# Patient Record
Sex: Female | Born: 1940 | Race: White | Hispanic: No | Marital: Married | Smoking: Never smoker
Health system: Southern US, Community
[De-identification: ages and names within clinical notes are randomized; demographics above are authoritative.]

## PROBLEM LIST (undated history)

## (undated) DIAGNOSIS — I10 Essential (primary) hypertension: Secondary | ICD-10-CM

## (undated) DIAGNOSIS — R569 Unspecified convulsions: Secondary | ICD-10-CM

## (undated) DIAGNOSIS — F32A Depression, unspecified: Secondary | ICD-10-CM

## (undated) DIAGNOSIS — F0280 Dementia in other diseases classified elsewhere without behavioral disturbance: Secondary | ICD-10-CM

## (undated) DIAGNOSIS — E785 Hyperlipidemia, unspecified: Secondary | ICD-10-CM

## (undated) DIAGNOSIS — G309 Alzheimer's disease, unspecified: Secondary | ICD-10-CM

## (undated) DIAGNOSIS — Z780 Asymptomatic menopausal state: Secondary | ICD-10-CM

## (undated) DIAGNOSIS — K219 Gastro-esophageal reflux disease without esophagitis: Secondary | ICD-10-CM

## (undated) DIAGNOSIS — M1990 Unspecified osteoarthritis, unspecified site: Secondary | ICD-10-CM

## (undated) DIAGNOSIS — F329 Major depressive disorder, single episode, unspecified: Secondary | ICD-10-CM

## (undated) DIAGNOSIS — R079 Chest pain, unspecified: Secondary | ICD-10-CM

## (undated) DIAGNOSIS — M810 Age-related osteoporosis without current pathological fracture: Secondary | ICD-10-CM

## (undated) HISTORY — DX: Gastro-esophageal reflux disease without esophagitis: K21.9

## (undated) HISTORY — DX: Dementia in other diseases classified elsewhere, unspecified severity, without behavioral disturbance, psychotic disturbance, mood disturbance, and anxiety: F02.80

## (undated) HISTORY — DX: Chest pain, unspecified: R07.9

## (undated) HISTORY — PX: OTHER SURGICAL HISTORY: SHX169

## (undated) HISTORY — DX: Unspecified osteoarthritis, unspecified site: M19.90

## (undated) HISTORY — DX: Essential (primary) hypertension: I10

## (undated) HISTORY — DX: Asymptomatic menopausal state: Z78.0

## (undated) HISTORY — DX: Alzheimer's disease, unspecified: G30.9

## (undated) HISTORY — DX: Unspecified convulsions: R56.9

## (undated) HISTORY — PX: APPENDECTOMY: SHX54

---

## 1998-11-28 ENCOUNTER — Other Ambulatory Visit: Admission: RE | Admit: 1998-11-28 | Discharge: 1998-11-28 | Payer: Self-pay | Admitting: *Deleted

## 2000-01-02 ENCOUNTER — Other Ambulatory Visit: Admission: RE | Admit: 2000-01-02 | Discharge: 2000-01-02 | Payer: Self-pay | Admitting: *Deleted

## 2001-02-02 ENCOUNTER — Other Ambulatory Visit: Admission: RE | Admit: 2001-02-02 | Discharge: 2001-02-02 | Payer: Self-pay | Admitting: *Deleted

## 2001-06-21 LAB — HM COLONOSCOPY

## 2004-10-21 ENCOUNTER — Encounter: Payer: Self-pay | Admitting: Family Medicine

## 2004-11-14 ENCOUNTER — Ambulatory Visit: Payer: Self-pay | Admitting: Family Medicine

## 2005-04-04 ENCOUNTER — Ambulatory Visit: Payer: Self-pay | Admitting: Family Medicine

## 2005-04-24 ENCOUNTER — Ambulatory Visit: Payer: Self-pay | Admitting: Family Medicine

## 2005-04-24 ENCOUNTER — Other Ambulatory Visit: Admission: RE | Admit: 2005-04-24 | Discharge: 2005-04-24 | Payer: Self-pay | Admitting: Family Medicine

## 2006-05-22 ENCOUNTER — Ambulatory Visit: Payer: Self-pay | Admitting: Family Medicine

## 2006-07-29 ENCOUNTER — Ambulatory Visit (HOSPITAL_COMMUNITY): Admission: RE | Admit: 2006-07-29 | Discharge: 2006-07-29 | Payer: Self-pay | Admitting: Neurology

## 2007-05-04 ENCOUNTER — Encounter: Payer: Self-pay | Admitting: Family Medicine

## 2007-05-26 ENCOUNTER — Encounter: Payer: Self-pay | Admitting: Family Medicine

## 2007-05-26 DIAGNOSIS — I10 Essential (primary) hypertension: Secondary | ICD-10-CM | POA: Insufficient documentation

## 2007-05-26 DIAGNOSIS — K219 Gastro-esophageal reflux disease without esophagitis: Secondary | ICD-10-CM

## 2007-06-08 ENCOUNTER — Encounter: Payer: Self-pay | Admitting: Family Medicine

## 2007-06-09 ENCOUNTER — Other Ambulatory Visit: Admission: RE | Admit: 2007-06-09 | Discharge: 2007-06-09 | Payer: Self-pay | Admitting: Family Medicine

## 2007-06-09 ENCOUNTER — Ambulatory Visit: Payer: Self-pay | Admitting: Family Medicine

## 2007-06-09 DIAGNOSIS — N390 Urinary tract infection, site not specified: Secondary | ICD-10-CM

## 2007-06-09 DIAGNOSIS — M818 Other osteoporosis without current pathological fracture: Secondary | ICD-10-CM

## 2007-06-09 DIAGNOSIS — N951 Menopausal and female climacteric states: Secondary | ICD-10-CM

## 2007-06-09 DIAGNOSIS — T50995A Adverse effect of other drugs, medicaments and biological substances, initial encounter: Secondary | ICD-10-CM

## 2007-06-09 DIAGNOSIS — E785 Hyperlipidemia, unspecified: Secondary | ICD-10-CM | POA: Insufficient documentation

## 2007-06-09 DIAGNOSIS — E039 Hypothyroidism, unspecified: Secondary | ICD-10-CM | POA: Insufficient documentation

## 2007-06-09 DIAGNOSIS — D649 Anemia, unspecified: Secondary | ICD-10-CM

## 2007-06-09 DIAGNOSIS — M549 Dorsalgia, unspecified: Secondary | ICD-10-CM | POA: Insufficient documentation

## 2007-06-19 ENCOUNTER — Encounter: Payer: Self-pay | Admitting: Family Medicine

## 2007-09-09 ENCOUNTER — Ambulatory Visit: Payer: Self-pay | Admitting: Family Medicine

## 2007-09-30 ENCOUNTER — Encounter: Payer: Self-pay | Admitting: Family Medicine

## 2007-12-10 ENCOUNTER — Telehealth: Payer: Self-pay | Admitting: Family Medicine

## 2007-12-24 ENCOUNTER — Ambulatory Visit: Payer: Self-pay | Admitting: Family Medicine

## 2007-12-24 DIAGNOSIS — F0280 Dementia in other diseases classified elsewhere without behavioral disturbance: Secondary | ICD-10-CM

## 2007-12-24 DIAGNOSIS — G309 Alzheimer's disease, unspecified: Secondary | ICD-10-CM

## 2007-12-24 DIAGNOSIS — S93409A Sprain of unspecified ligament of unspecified ankle, initial encounter: Secondary | ICD-10-CM | POA: Insufficient documentation

## 2008-06-23 ENCOUNTER — Ambulatory Visit: Payer: Self-pay | Admitting: Family Medicine

## 2008-06-23 DIAGNOSIS — M899 Disorder of bone, unspecified: Secondary | ICD-10-CM | POA: Insufficient documentation

## 2008-06-23 DIAGNOSIS — H6120 Impacted cerumen, unspecified ear: Secondary | ICD-10-CM

## 2008-06-23 DIAGNOSIS — M7750 Other enthesopathy of unspecified foot: Secondary | ICD-10-CM | POA: Insufficient documentation

## 2008-06-23 DIAGNOSIS — M21619 Bunion of unspecified foot: Secondary | ICD-10-CM

## 2008-06-23 DIAGNOSIS — M949 Disorder of cartilage, unspecified: Secondary | ICD-10-CM

## 2008-06-23 LAB — CONVERTED CEMR LAB
Blood in Urine, dipstick: NEGATIVE
Ketones, urine, test strip: NEGATIVE
Nitrite: NEGATIVE
Protein, U semiquant: NEGATIVE
Specific Gravity, Urine: 1.015

## 2008-06-28 LAB — CONVERTED CEMR LAB
AST: 29 units/L (ref 0–37)
Alkaline Phosphatase: 74 units/L (ref 39–117)
Basophils Relative: 0.7 % (ref 0.0–3.0)
Bilirubin, Direct: 0.1 mg/dL (ref 0.0–0.3)
Chloride: 102 meq/L (ref 96–112)
Cholesterol: 234 mg/dL (ref 0–200)
Creatinine, Ser: 1 mg/dL (ref 0.4–1.2)
Direct LDL: 142.9 mg/dL
Eosinophils Absolute: 0.2 10*3/uL (ref 0.0–0.7)
Eosinophils Relative: 5.3 % — ABNORMAL HIGH (ref 0.0–5.0)
GFR calc Af Amer: 71 mL/min
Lymphocytes Relative: 40.9 % (ref 12.0–46.0)
MCHC: 34.2 g/dL (ref 30.0–36.0)
Monocytes Absolute: 0.3 10*3/uL (ref 0.1–1.0)
Monocytes Relative: 7.6 % (ref 3.0–12.0)
Neutro Abs: 2 10*3/uL (ref 1.4–7.7)
Neutrophils Relative %: 45.5 % (ref 43.0–77.0)
RDW: 12.2 % (ref 11.5–14.6)
Sodium: 137 meq/L (ref 135–145)
VLDL: 14 mg/dL (ref 0–40)
Vit D, 1,25-Dihydroxy: 25 — ABNORMAL LOW (ref 30–89)
WBC: 4.3 10*3/uL — ABNORMAL LOW (ref 4.5–10.5)

## 2008-07-04 ENCOUNTER — Encounter: Payer: Self-pay | Admitting: Family Medicine

## 2008-07-28 ENCOUNTER — Inpatient Hospital Stay (HOSPITAL_COMMUNITY): Admission: EM | Admit: 2008-07-28 | Discharge: 2008-08-02 | Payer: Self-pay | Admitting: Emergency Medicine

## 2008-09-07 ENCOUNTER — Ambulatory Visit: Payer: Self-pay | Admitting: Family Medicine

## 2008-09-07 DIAGNOSIS — Z96649 Presence of unspecified artificial hip joint: Secondary | ICD-10-CM

## 2008-09-07 DIAGNOSIS — S72009A Fracture of unspecified part of neck of unspecified femur, initial encounter for closed fracture: Secondary | ICD-10-CM | POA: Insufficient documentation

## 2008-12-07 ENCOUNTER — Encounter: Payer: Self-pay | Admitting: Family Medicine

## 2008-12-21 ENCOUNTER — Encounter: Payer: Self-pay | Admitting: Family Medicine

## 2009-01-04 ENCOUNTER — Telehealth: Payer: Self-pay | Admitting: Family Medicine

## 2009-06-28 ENCOUNTER — Ambulatory Visit: Payer: Self-pay | Admitting: Family Medicine

## 2009-06-28 ENCOUNTER — Encounter: Payer: Self-pay | Admitting: Family Medicine

## 2009-06-28 ENCOUNTER — Other Ambulatory Visit: Admission: RE | Admit: 2009-06-28 | Discharge: 2009-06-28 | Payer: Self-pay | Admitting: Family Medicine

## 2009-06-28 DIAGNOSIS — M129 Arthropathy, unspecified: Secondary | ICD-10-CM | POA: Insufficient documentation

## 2009-06-28 LAB — HM MAMMOGRAPHY

## 2009-06-29 ENCOUNTER — Encounter: Payer: Self-pay | Admitting: Family Medicine

## 2009-07-04 ENCOUNTER — Encounter: Payer: Self-pay | Admitting: Family Medicine

## 2009-08-07 ENCOUNTER — Encounter: Payer: Self-pay | Admitting: Family Medicine

## 2009-09-12 ENCOUNTER — Encounter (INDEPENDENT_AMBULATORY_CARE_PROVIDER_SITE_OTHER): Payer: Self-pay | Admitting: Internal Medicine

## 2009-09-12 ENCOUNTER — Ambulatory Visit: Payer: Self-pay | Admitting: Vascular Surgery

## 2009-09-12 ENCOUNTER — Inpatient Hospital Stay (HOSPITAL_COMMUNITY): Admission: EM | Admit: 2009-09-12 | Discharge: 2009-09-13 | Payer: Self-pay | Admitting: Emergency Medicine

## 2010-02-17 ENCOUNTER — Emergency Department (HOSPITAL_COMMUNITY): Admission: EM | Admit: 2010-02-17 | Discharge: 2010-02-17 | Payer: Self-pay | Admitting: Emergency Medicine

## 2010-04-04 ENCOUNTER — Encounter: Payer: Self-pay | Admitting: Family Medicine

## 2010-04-09 ENCOUNTER — Encounter: Payer: Self-pay | Admitting: Family Medicine

## 2010-04-18 ENCOUNTER — Encounter: Payer: Self-pay | Admitting: Family Medicine

## 2010-06-21 ENCOUNTER — Telehealth: Payer: Self-pay | Admitting: Family Medicine

## 2010-07-05 ENCOUNTER — Telehealth: Payer: Self-pay | Admitting: Family Medicine

## 2010-07-26 ENCOUNTER — Encounter: Payer: Self-pay | Admitting: Family Medicine

## 2010-09-27 ENCOUNTER — Ambulatory Visit: Payer: Self-pay | Admitting: Family Medicine

## 2010-09-27 ENCOUNTER — Encounter: Payer: Self-pay | Admitting: Family Medicine

## 2010-09-27 DIAGNOSIS — R1904 Left lower quadrant abdominal swelling, mass and lump: Secondary | ICD-10-CM

## 2010-09-27 DIAGNOSIS — F329 Major depressive disorder, single episode, unspecified: Secondary | ICD-10-CM

## 2010-09-27 DIAGNOSIS — G4730 Sleep apnea, unspecified: Secondary | ICD-10-CM | POA: Insufficient documentation

## 2010-09-27 DIAGNOSIS — R55 Syncope and collapse: Secondary | ICD-10-CM

## 2010-09-27 LAB — CONVERTED CEMR LAB
Ketones, urine, test strip: NEGATIVE
Protein, U semiquant: NEGATIVE
Specific Gravity, Urine: 1.01
pH: 6

## 2010-09-28 ENCOUNTER — Telehealth: Payer: Self-pay | Admitting: Family Medicine

## 2010-10-02 LAB — CONVERTED CEMR LAB
Basophils Absolute: 0 10*3/uL (ref 0.0–0.1)
Basophils Relative: 0.6 % (ref 0.0–3.0)
Bilirubin, Direct: 0.2 mg/dL (ref 0.0–0.3)
Calcium: 9.8 mg/dL (ref 8.4–10.5)
Chloride: 102 meq/L (ref 96–112)
Cholesterol: 151 mg/dL (ref 0–200)
Eosinophils Absolute: 0.2 10*3/uL (ref 0.0–0.7)
GFR calc non Af Amer: 67.27 mL/min (ref >60.00)
HCT: 47.7 % — ABNORMAL HIGH (ref 36.0–46.0)
LDL Cholesterol: 63 mg/dL (ref 0–99)
Lymphocytes Relative: 29.5 % (ref 12.0–46.0)
MCV: 93.6 fL (ref 78.0–100.0)
Monocytes Absolute: 0.3 10*3/uL (ref 0.1–1.0)
Monocytes Relative: 6.7 % (ref 3.0–12.0)
Platelets: 150 10*3/uL (ref 150.0–400.0)
Potassium: 3.9 meq/L (ref 3.5–5.1)
RDW: 13.2 % (ref 11.5–14.6)
Sodium: 138 meq/L (ref 135–145)
Total Bilirubin: 1 mg/dL (ref 0.3–1.2)
Total CHOL/HDL Ratio: 2
Total Protein: 7.7 g/dL (ref 6.0–8.3)
Triglycerides: 59 mg/dL (ref 0.0–149.0)
Vit D, 25-Hydroxy: 31 ng/mL (ref 30–89)
WBC: 4.9 10*3/uL (ref 4.5–10.5)

## 2010-10-11 ENCOUNTER — Encounter
Admission: RE | Admit: 2010-10-11 | Discharge: 2010-10-11 | Payer: Self-pay | Source: Home / Self Care | Attending: Family Medicine | Admitting: Family Medicine

## 2010-10-11 ENCOUNTER — Emergency Department (HOSPITAL_COMMUNITY)
Admission: EM | Admit: 2010-10-11 | Discharge: 2010-10-12 | Payer: Self-pay | Source: Home / Self Care | Admitting: Emergency Medicine

## 2010-10-25 ENCOUNTER — Telehealth: Payer: Self-pay | Admitting: Family Medicine

## 2010-11-14 ENCOUNTER — Ambulatory Visit
Admission: RE | Admit: 2010-11-14 | Discharge: 2010-11-14 | Payer: Self-pay | Source: Home / Self Care | Attending: Gynecologic Oncology | Admitting: Gynecologic Oncology

## 2010-11-15 LAB — CA 125: CA 125: 11.9 U/mL (ref 0.0–30.2)

## 2010-11-18 LAB — CONVERTED CEMR LAB
ALT: 18 units/L (ref 0–35)
ALT: 19 units/L (ref 0–35)
AST: 32 units/L (ref 0–37)
Albumin: 4 g/dL (ref 3.5–5.2)
Albumin: 4.6 g/dL (ref 3.5–5.2)
BUN: 10 mg/dL (ref 6–23)
BUN: 13 mg/dL (ref 6–23)
Basophils Absolute: 0 10*3/uL (ref 0.0–0.1)
Basophils Relative: 0.2 % (ref 0.0–3.0)
Bilirubin Urine: NEGATIVE
Bilirubin, Direct: 0.2 mg/dL (ref 0.0–0.3)
Calcium: 9.7 mg/dL (ref 8.4–10.5)
Calcium: 9.7 mg/dL (ref 8.4–10.5)
Cholesterol: 149 mg/dL (ref 0–200)
Cholesterol: 205 mg/dL (ref 0–200)
Creatinine, Ser: 1 mg/dL (ref 0.4–1.2)
Eosinophils Absolute: 0.3 10*3/uL (ref 0.0–0.7)
GFR calc Af Amer: 57 mL/min
GFR calc non Af Amer: 58.25 mL/min (ref >60)
Glucose, Bld: 81 mg/dL (ref 70–99)
Glucose, Bld: 88 mg/dL (ref 70–99)
Glucose, Urine, Semiquant: NEGATIVE
HCT: 46 % (ref 36.0–46.0)
HCT: 46.6 % — ABNORMAL HIGH (ref 36.0–46.0)
Hemoglobin: 15.9 g/dL — ABNORMAL HIGH (ref 12.0–15.0)
Lymphocytes Relative: 37.8 % (ref 12.0–46.0)
MCHC: 34.2 g/dL (ref 30.0–36.0)
MCV: 92 fL (ref 78.0–100.0)
MCV: 94 fL (ref 78.0–100.0)
Monocytes Absolute: 0.3 10*3/uL (ref 0.1–1.0)
Monocytes Absolute: 0.4 10*3/uL (ref 0.2–0.7)
Monocytes Relative: 7.9 % (ref 3.0–12.0)
Neutro Abs: 2.4 10*3/uL (ref 1.4–7.7)
Neutrophils Relative %: 47.4 % (ref 43.0–77.0)
Nitrite: NEGATIVE
Pap Smear: NORMAL
Platelets: 166 10*3/uL (ref 150.0–400.0)
Platelets: 220 10*3/uL (ref 150–400)
Protein, U semiquant: NEGATIVE
RBC: 4.96 M/uL (ref 3.87–5.11)
RBC: 5 M/uL (ref 3.87–5.11)
TSH: 1.83 microintl units/mL (ref 0.35–5.50)
TSH: 1.85 microintl units/mL (ref 0.35–5.50)
Total Bilirubin: 1.3 mg/dL — ABNORMAL HIGH (ref 0.3–1.2)
Total Bilirubin: 1.6 mg/dL — ABNORMAL HIGH (ref 0.3–1.2)
Total Protein: 7.2 g/dL (ref 6.0–8.3)
Total Protein: 7.5 g/dL (ref 6.0–8.3)
Vit D, 1,25-Dihydroxy: 33 (ref 20–57)
WBC Urine, dipstick: NEGATIVE
WBC: 4.2 10*3/uL — ABNORMAL LOW (ref 4.5–10.5)
pH: 6

## 2010-11-19 NOTE — Consult Note (Signed)
Ebony [name], POLAND NO.:  [id number]  MEDICAL RECORD NO.:  [id number]          PATIENT TYPE:  OUT  LOCATION:  GYN                          FACILITY:  [facility]  PHYSICIAN:  Paola A. Duard Brady, MD    DATE OF BIRTH:  1941-08-04  DATE OF CONSULTATION:  11/14/2010 DATE OF DISCHARGE:                                CONSULTATION   CONSULTATION REQUESTED BY:  Tawny Asal, MD  REASON FOR CONSULTATION:  The patient is seen today in consultation at the request of Dr. Scotty Court.  HISTORY OF PRESENT ILLNESS:  Ebony [name] is a 70 year old gravida 0, who is referred to [place] for evaluation of a left ovarian cyst.  Based on documentation that we have, she had a CT scan in November of 2010 that showed approximately a 2 cm left adnexal ovarian mass.  Follow-up in April of 2011 similarly showed a 2.2 x 2.4 cm left adnexal/ovarian cyst that was present.  There was a moderate amount of stool in the sigmoid or colon.  There was no other evidence of any pathology.  She did have a left hip replacement that obscured details within the lower pelvis. Ultrasound was obtained in December of 2011 and compared to that from the CT from April of 2011, and it showed a 2.7 x 2.1 x 2.9 cm left ovarian cyst with no complicating features noted on ultrasound.  The right ovary was of normal size.  There was no free fluid.  The endometrium measured 3.6 mm in thickness.  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  The ultrasound in December was in part prompted when Dr. Scotty Court saw the patient.  There was a questionable fullness in the left lower quadrant, which based on this information could very well be due to stool as constipation appears to be an issue.  Husband reports that the patient is otherwise in her usual state of health.  REVIEW OF SYSTEMS:  She does have some episodes of fainting; it is worse when her sodium is  present.  Over the past few weeks, she had some cardiac medications and had a few episodes of these symptoms and they took her off her blood pressure medicine, and it was felt that some of these symptoms might be due to hypotension.  She does occasionally have some daytime pain on the left side; it never wakes her up.  She complains of it to her husband a few times a week.  It is hard to discern what type of pain this might be and if it might be related to her prior left hip surgery.  She denies any change in her bowel or bladder habits.  She does have constipation, which is a longstanding issue secondary to inactivity; they do use Milk of Magnesia as needed. She has not had any change in bowel movements.  She denies any vaginal bleeding.  Her weight is stable to slightly up.  She and her husband states that they might need to go on diets.  She denies any nausea,  vomiting, early satiety, increased abdominal girth.  She does get somewhat "queasy" with some of the medications she takes.  She denies any fevers, chills, chest pain or shortness of breath.  PAST MEDICAL HISTORY:  Reflux, Alzheimer's disease, hypercholesterolemia, osteoporosis.  ALLERGIES:  None.  MEDICATIONS:  In the morning, she takes Avapro 150 mg, Namenda 10 mg, Aricept 5 mg and Prilosec OTC 20 mg.  At noon, she takes Crestor 10 mg, Aricept 5 mg, methylphenidate 10 mg.  In the evening, she takes Aricept 5 mg, Namenda 10 mg, Avapro 150 mg, calcium with D three 600 mg; and at bedtime, she takes Aricept 5 mg.  P.r.n., she uses tramadol and Zyrtec. She takes Boniva monthly.  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  SOCIAL HISTORY:  She denies use of tobacco.  They drink alcohol occasionally.  Her husband is a retired Acupuncturist and studies Arts development officer.  She is a retired Diplomatic Services operational officer.  FAMILY HISTORY:  She has a brother with hypertension.  She has a  sister who had breast cancer in her 63s.  HEALTH MAINTENANCE:  She is up-to-date on her colonoscopies.  She is up- to-date on her mammograms.  Her last colonoscopy was 7 years ago; she is on a 10-year plan.  PHYSICAL EXAMINATION:  VITAL SIGNS:  Weight 149 pounds, height 5 feet 1 inch, blood pressure 142/82, pulse 68. GENERAL:  Well-nourished, well-developed female in no acute distress. NECK:  Supple.  There is no lymphadenopathy and no thyromegaly. LUNGS:  Clear to auscultation bilaterally. CARDIOVASCULAR:  Regular rate and rhythm. ABDOMEN:  Abdomen is soft, nontender, nondistended.  There are no palpable masses or hepatosplenomegaly.  Groins are negative for adenopathy. EXTREMITIES:  There is no edema. PELVIC:  External genitalia is within normal limits, though markedly atrophic.  The vagina is markedly atrophic.  The cervix is somewhat agglutinated with the vagina.  Bimanual examination of the cervix is palpably normal with the corpus of normal size, shape and consistency. There are no adnexal masses.  ASSESSMENT:  A 70 year old with a 2 cm left ovarian cyst that has been stable dating back to imaging that we have in November of 2010 at which time the cyst was noted on CT scan.  It was stable on a CT scan in April of 2011 and on ultrasound of December of 2011.  I do not believe that this represents a malignancy.  I do not see that a CA-125 has been drawn.  We will draw that today.  At this point, I would not recommend surgical intervention.  We will follow up with results of her CA-125 and notify her and her husband with the results.  If her CA-125 is normal, since the cyst has essentially been followed expectantly for 1 year and has not increased in size, I would not recommend any other follow-up and as needed p.r.n.     Paola A. Duard Brady, MD     PAG/MEDQ  D:  11/14/2010  T:  11/14/2010  Job:  [id number]  cc:   Ellin Saba., MD [address]  [place] [address]  Telford Nab, R.N. [address] [place], [place] [address]  Electronically Signed by Cleda Mccreedy MD on 11/19/2010 02:30:27 PM

## 2010-11-20 NOTE — Progress Notes (Signed)
Summary: nexium rx  Phone Note From Pharmacy   Summary of Call: patient would like to switch to nexium for cost concerns Initial call taken by: Kern Reap CMA Duncan Dull),  September 28, 2010 1:25 PM    New/Updated Medications: NEXIUM 40 MG CPDR (ESOMEPRAZOLE MAGNESIUM) take one tab by mouth once daily Prescriptions: NEXIUM 40 MG CPDR (ESOMEPRAZOLE MAGNESIUM) take one tab by mouth once daily  #30 x 0   Entered by:   Kern Reap CMA (AAMA)   Authorized by:   Judithann Sheen MD   Signed by:   Kern Reap CMA (AAMA) on 09/28/2010   Method used:   Faxed to ...       [facility] (mail-order)             , [place]         Ph:        Fax: [phone]   RxID:   [id number]

## 2010-11-20 NOTE — Progress Notes (Signed)
Summary: [facility] refill  Phone Note Refill Request Message from:  Fax from Pharmacy on July 05, 2010 10:45 AM  Refills Requested: Medication #1:  VOLTAREN 1 % GEL apply to fingers and feet qid   Dosage confirmed as above?Dosage Confirmed Initial call taken by: Josph Macho RMA,  July 05, 2010 10:45 AM  Follow-up for Phone Call        OK to refill with same sig, disp 5 tubes with 1 rf Follow-up by: Danise Edge MD,  July 05, 2010 12:53 PM    Prescriptions: VOLTAREN 1 % GEL (DICLOFENAC SODIUM) apply to fingers and feet qid, include knee  #100 Gram x 0   Entered by:   Josph Macho RMA   Authorized by:   Danise Edge MD   Signed by:   Josph Macho RMA on 07/05/2010   Method used:   Electronically to        [facility] (retail)       [address]       [place], [place]  [address]       Ph: [phone] or [phone]       Fax: [phone]   RxID:   [id number]

## 2010-11-20 NOTE — Letter (Signed)
Summary: [facility]  [facility]   Imported By: Maryln Gottron 06/19/2010 13:02:45  _____________________________________________________________________  External Attachment:    Type:   Image     Comment:   External Document

## 2010-11-20 NOTE — Progress Notes (Signed)
Summary: results bone density  Phone Note Outgoing Call   Call placed by: gina rn Call placed to: Patient Summary of Call: results of bone density per dr Scotty Court.  spoke with spouse Roe Coombs results given and Boniva called to [facility] and he stated she is taking her calcium with vit d.  Initial call taken by: Pura Spice, RN,  June 21, 2010 8:36 AM    New/Updated Medications: BONIVA 150 MG TABS (IBANDRONATE SODIUM) take 1 tab by mouth  as instructed monthly Prescriptions: BONIVA 150 MG TABS (IBANDRONATE SODIUM) take 1 tab by mouth  as instructed monthly  #1 x 10   Entered by:   Pura Spice, RN   Authorized by:   Judithann Sheen MD   Signed by:   Pura Spice, RN on 06/21/2010   Method used:   Electronically to        [facility] (retail)       [address]       [place], [place]  [address]       Ph: [phone] or [phone]       Fax: [phone]   RxID:   [id number]

## 2010-11-22 NOTE — Progress Notes (Signed)
Summary: refill request  Phone Note Refill Request Message from:  Fax from Pharmacy on October 25, 2010 4:43 PM  Refills Requested: Medication #1:  AVAPRO 150 MG  TABS two times a day Initial call taken by: Kern Reap CMA Duncan Dull),  October 25, 2010 4:43 PM    Prescriptions: AVAPRO 150 MG  TABS (IRBESARTAN) two times a day  #60 x 11   Entered by:   Kern Reap CMA (AAMA)   Authorized by:   Judithann Sheen MD   Signed by:   Kern Reap CMA (AAMA) on 10/25/2010   Method used:   Electronically to        [id number] (retail)       [address]       [place], [place]  [address]       Ph: [phone]       Fax: [phone]   RxID:   [id number]

## 2010-11-22 NOTE — Progress Notes (Signed)
Summary: refill  Phone Note Refill Request Message from:  Fax from Pharmacy on October 25, 2010 4:44 PM  Refills Requested: Medication #1:  TRAMADOL HCL 50 MG TABS 1 q4h as needed headache  or pain Initial call taken by: Kern Reap CMA Duncan Dull),  October 25, 2010 4:44 PM    Prescriptions: TRAMADOL HCL 50 MG TABS (TRAMADOL HCL) 1 q4h as needed headache  or pain  #100 x 5   Entered by:   Kern Reap CMA (AAMA)   Authorized by:   Judithann Sheen MD   Signed by:   Kern Reap CMA (AAMA) on 10/25/2010   Method used:   Electronically to        [facility] (retail)       [address]       [place], [place]  [address]       Ph: [phone]       Fax: [phone]   RxID:   [id number]

## 2010-11-22 NOTE — Assessment & Plan Note (Signed)
Summary: CPX (PT WILL COME IN FASTING) // RS rsc bmp/njr/pt rsc from b...   Vital Signs:  Patient profile:   70 year old female Menstrual status:  postmenopausal Height:      60 inches Weight:      137 pounds BMI:     26.85 O2 Sat:      94 % on Room air Temp:     98.1 degrees F oral Pulse rate:   69 / minute Pulse rhythm:   regular BP sitting:   160 / 100  (left arm)  Vitals Entered By: Jeremy Johann CMA (September 27, 2010 11:05 AM)  O2 Flow:  Room air CC: yearly fasting, refills   History of Present Illness: This 70 year old white married patient with Alzheimer is in for discussion and followup of her medical problems as well as obtaining laboratory studies and refill on her medications. She has known hypertension for several years and is on medication and it does fluctuate and usually is elevated when in the office In the past year she hasn't had episodes of unexplained syncope. Haven't had the last episode 2 months ago. She is under the care of of a neurologist and no etiology of the syncope is found She is on medication for Alzheimer's hypertension GERD and does have chronic arthritis which is relieved somewhat with diclofenac Electrocardiogram done today is normal  Preventive Screening-Counseling & Management  Alcohol-Tobacco     Alcohol drinks/day: 0     Smoking Status: never  Current Medications (verified): 1)  Multivitamins   Caps (Multiple Vitamin) .... Once Daily 2)  Citracal + D 250-200 Mg-Unit  Tabs (Calcium Citrate-Vitamin D) .... Take 1 Tablet By Mouth Two Times A Day 3)  Aricept 5 Mg  Tabs (Donepezil Hcl) .... Two Times A Day 4)  Avapro 150 Mg  Tabs (Irbesartan) .... Two Times A Day 5)  Voltaren 1 % Gel (Diclofenac Sodium) .... Apply To Fingers and Feet Qid, Include Knee 6)  Nexium 40 Mg Cpdr (Esomeprazole Magnesium) .Marland Kitchen.. 1 Qd 7)  Crestor 10 Mg Tabs (Rosuvastatin Calcium) .Marland Kitchen.. 1 By Mouth Once Daily 8)  Namenda 10 Mg Tabs (Memantine Hcl) .Marland Kitchen.. 1 Bid 9)   Tramadol Hcl 50 Mg Tabs (Tramadol Hcl) .Marland Kitchen.. 1 Q4h As Needed Headache  or Pain 10)  Boniva 150 Mg Tabs (Ibandronate Sodium) .... Take 1 Tab By Mouth  As Instructed Monthly  Allergies (verified): No Known Drug Allergies  Past History:  Past Medical History: GERD Hypertension unexplained chest pain Alzheimer's Chronic arthritis Postmenopausal  Past Surgical History: left hip replacement following fracture  Social History: Smoking Status:  never  Review of Systems      See HPI General:  See HPI; Complains of fatigue. Eyes:  Denies blurring, discharge, double vision, eye irritation, eye pain, halos, itching, light sensitivity, red eye, vision loss-1 eye, and vision loss-both eyes. ENT:  Denies decreased hearing, difficulty swallowing, ear discharge, earache, hoarseness, nasal congestion, nosebleeds, postnasal drainage, ringing in ears, sinus pressure, and sore throat. CV:  Denies bluish discoloration of lips or nails, chest pain or discomfort, difficulty breathing at night, difficulty breathing while lying down, fainting, fatigue, leg cramps with exertion, lightheadness, near fainting, palpitations, shortness of breath with exertion, swelling of feet, swelling of hands, and weight gain. Resp:  Denies chest discomfort, chest pain with inspiration, cough, coughing up blood, excessive snoring, hypersomnolence, morning headaches, pleuritic, shortness of breath, sputum productive, and wheezing. GI:  See HPI; Complains of indigestion and nausea. GU:  See  HPI; fullness in left lower quadrant.  Physical Exam  General:  Well-developed,well-nourished,in no acute distress; alert,appropriate and cooperative throughout examination Head:  Normocephalic and atraumatic without obvious abnormalities. No apparent alopecia or balding. Eyes:  No corneal or conjunctival inflammation noted. EOMI. Perrla. Funduscopic exam benign, without hemorrhages, exudates or papilledema. Vision grossly normal. Ears:   External ear exam shows no significant lesions or deformities.  Otoscopic examination reveals clear canals, tympanic membranes are intact bilaterally without bulging, retraction, inflammation or discharge. Hearing is grossly normal bilaterally. Nose:  External nasal examination shows no deformity or inflammation. Nasal mucosa are pink and moist without lesions or exudates. Mouth:  Oral mucosa and oropharynx without lesions or exudates.  Teeth in good repair. Neck:  No deformities, masses, or tenderness noted. Chest Wall:  No deformities, masses, or tenderness noted. Breasts:  No mass, nodules, thickening, tenderness, bulging, retraction, inflamation, nipple discharge or skin changes noted.   Lungs:  Normal respiratory effort, chest expands symmetrically. Lungs are clear to auscultation, no crackles or wheezes. Heart:  Normal rate and regular rhythm. S1 and S2 normal without gallop, murmur, click, rub or other extra sounds. Abdomen:  liver spleen and kidneys are nonpalpable no other masses except sensation of a mass in the left poor abdomen, sensation of fullness which is nontender Bowel sounds normal Rectal:  on exam Genitalia:  not examined at this time Msk:  arthritic changes of her PIP joint as well as some tenderness of the left heel and feet Pulses:  R and L carotid,radial,femoral,dorsalis pedis and posterior tibial pulses are full and equal bilaterally Extremities:  No clubbing, cyanosis, edema, or deformity noted with normal full range of motion of all joints.   Neurologic:  patient has lack of short-term memory, unable to keep up with oral medications and her lives upon her husband for most instructions Skin:  Intact without suspicious lesions or rashes Cervical Nodes:  No lymphadenopathy noted Axillary Nodes:  No palpable lymphadenopathy Inguinal Nodes:  No significant adenopathy Psych:  evident early alzheimer, depressed   Impression & Recommendations:  Problem # 1:  ABDOMINAL  MASS, LEFT LOWER QUADRANT (ZOX-096.04) Assessment New  Orders: T-Blood Typing ,ABO (54098-11914) Radiology Referral (Radiology) Gynecologic Referral (Gyn)  Problem # 2:  SYNCOPE (ICD-780.2) Assessment: Deteriorated unknown etiology, under care of a neurologist  Problem # 3:  SLEEP APNEA (ICD-780.57) Assessment: Improved  Problem # 4:  ARTHRITIS, CHRONIC (ICD-716.90) Assessment: Improved Voltaren jail percent  Problem # 5:  HIP REPLACEMENT, LEFT, HX OF (ICD-V43.64) Assessment: Improved  Problem # 6:  METATARSALGIA (ICD-726.70) Assessment: Improved improved using Voltarin gel 1%  Problem # 7:  ALZHEIMERS DISEASE (ICD-331.0) Assessment: Unchanged Aricept plus Namenda  Problem # 8:  GERD (ICD-530.81) Assessment: Improved  The following medications were removed from the medication list:    Nexium 40 Mg Cpdr (Esomeprazole magnesium) .Marland Kitchen... 1 qd Her updated medication list for this problem includes:    Nexium 20 Mg Cpdr (Esomeprazole magnesium) .Marland Kitchen... 1 bid    Nexium 40 Mg Cpdr (Esomeprazole magnesium) .Marland Kitchen... Take one tab by mouth once daily  Orders: Prescription Created Electronically [phone])  Problem # 9:  HYPERTENSION (ICD-401.9) Assessment: Improved  The following medications were removed from the medication list:    Amlodipine Besylate 5 Mg Tabs (Amlodipine besylate) .Marland Kitchen... 1 once daily for blood pressure Her updated medication list for this problem includes:    Avapro 150 Mg Tabs (Irbesartan) .Marland Kitchen..Marland Kitchen Two times a day    Lisinopril 10 Mg Tabs (Lisinopril) .Marland Kitchen... 1 once  daily for blood pressure  Orders: EKG w/ Interpretation (93[address]) Specimen Handling ([address]) TLB-BMP (Basic Metabolic Panel-BMET) (80048-METABOL)  Problem # 10:  HYPERLIPIDEMIA NEC/NOS (ICD-272.4) Assessment: Improved  Her updated medication list for this problem includes:    Crestor 10 Mg Tabs (Rosuvastatin calcium) .Marland Kitchen... 1 by mouth once daily  Orders: Specimen Handling ([address]) TLB-Lipid Panel  (80061-LIPID) TLB-Hepatic/Liver Function Pnl (80076-HEPATIC)  Problem # 11:  DEPRESSION (ICD-311) Assessment: New Pristique 50 mg qd  Complete Medication List: 1)  Multivitamins Caps (Multiple vitamin) .... Once daily 2)  Citracal + D 250-200 Mg-unit Tabs (Calcium citrate-vitamin d) .... Take 1 tablet by mouth two times a day 3)  Aricept 5 Mg Tabs (Donepezil hcl) .... Take one tab four times a day 4)  Avapro 150 Mg Tabs (Irbesartan) .... Two times a day 5)  Voltaren 1 % Gel (Diclofenac sodium) .... Apply to fingers and feet qid, include knee 6)  Crestor 10 Mg Tabs (Rosuvastatin calcium) .Marland Kitchen.. 1 by mouth once daily 7)  Namenda 10 Mg Tabs (Memantine hcl) .... Take 2 tabs two times a day 8)  Tramadol Hcl 50 Mg Tabs (Tramadol hcl) .Marland Kitchen.. 1 q4h as needed headache  or pain 9)  Boniva 150 Mg Tabs (Ibandronate sodium) .... Take 1 tab by mouth  as instructed monthly 10)  Nexium 20 Mg Cpdr (Esomeprazole magnesium) .Marland Kitchen.. 1 bid 11)  Lisinopril 10 Mg Tabs (Lisinopril) .Marland Kitchen.. 1 once daily for blood pressure 12)  Ritalin 10 Mg Tabs (Methylphenidate hcl) .Marland Kitchen.. 1 once daily for depression, increase to morn and noon if needed 13)  Nexium 40 Mg Cpdr (Esomeprazole magnesium) .... Take one tab by mouth once daily  Other Orders: Venipuncture (09811) T-Vitamin D (25-Hydroxy) (91478-29562) UA Dipstick w/o Micro (automated)  (81003) TLB-CBC Platelet - w/Differential (85025-CBCD)  Patient Instructions: 1)  Blood pressure elevated, to add lisinopril 20 mg  2)  Will call results of lab studies 3)  to change N3xiums 20 mg two times a day 4)  To schedule ultrasound  lower abdomen to evaluatw mass left lower quadrent 5)  will callregarding new Neurologist Prescriptions: RITALIN 10 MG TABS (METHYLPHENIDATE HCL) 1 once daily for depression, increase to morn and noon if needed  #60 x 00   Entered and Authorized by:   Judithann Sheen MD   Signed by:   Judithann Sheen MD on 09/27/2010   Method used:   Printed  then faxed to ...       [facility] (retail)       [address]       [place], [place]  [address]       Ph: [phone]       Fax: [phone]   RxID:   [id number] LISINOPRIL 10 MG TABS (LISINOPRIL) 1 once daily for blood pressure  #30 x 11   Entered and Authorized by:   Judithann Sheen MD   Signed by:   Judithann Sheen MD on 09/27/2010   Method used:   Electronically to        [facility] (retail)       [address]       [place], [place]  [address]       Ph: [phone]       Fax: [phone]   RxID:   [id number] BONIVA 150 MG TABS (IBANDRONATE SODIUM) take 1 tab by mouth  as instructed monthly  #1 x 11   Entered and Authorized by:   Valarie Merino  Montez Hageman MD   Signed by:   Judithann Sheen MD on 09/27/2010   Method used:   Electronically to        [facility] (retail)       [address]       [place], [place]  [address]       Ph: [phone]       Fax: [phone]   RxID:   [id number] TRAMADOL HCL 50 MG TABS (TRAMADOL HCL) 1 q4h as needed headache  or pain  #100 x 5   Entered and Authorized by:   Judithann Sheen MD   Signed by:   Judithann Sheen MD on 09/27/2010   Method used:   Electronically to        [facility] (retail)       [address]       [place], [place]  [address]       Ph: [phone]       Fax: [phone]   RxID:   [id number] NAMENDA 10 MG TABS (MEMANTINE HCL) 1 bid  #60 x 11   Entered and Authorized by:   Judithann Sheen MD   Signed by:   Judithann Sheen MD on 09/27/2010   Method used:   Electronically to        [facility] (retail)       [address]       [place], [place]  [address]       Ph: [phone]       Fax: [phone]   RxID:   [id number] CRESTOR 10 MG TABS (ROSUVASTATIN CALCIUM) 1 by mouth once daily  #30 x 11   Entered and Authorized by:   Judithann Sheen MD   Signed by:   Judithann Sheen MD on 09/27/2010   Method used:   Electronically to        [facility] (retail)        [address]       [place], [place]  [address]       Ph: [phone]       Fax: [phone]   RxID:   [id number] NEXIUM 20 MG CPDR (ESOMEPRAZOLE MAGNESIUM) 1 bid  #60 x 11   Entered and Authorized by:   Judithann Sheen MD   Signed by:   Judithann Sheen MD on 09/27/2010   Method used:   Electronically to        [facility] (retail)       [address]       [place], [place]  [address]       Ph: [phone]       Fax: [phone]   RxID:   [id number] VOLTAREN 1 % GEL (DICLOFENAC SODIUM) apply to fingers and feet qid, include knee  #100gms x 11   Entered and Authorized by:   Judithann Sheen MD   Signed by:   Judithann Sheen MD on 09/27/2010   Method used:   Electronically to        [facility] (retail)       [address]       [place], [place]  [address]       Ph: [phone]       Fax: [phone]   RxID:   [id number] AVAPRO 150 MG  TABS (IRBESARTAN) two times a day  #60 x 11   Entered and Authorized by:   Loni Dolly  Alphonzo Severance MD   Signed by:   Judithann Sheen MD on 09/27/2010   Method used:   Electronically to        [facility] (retail)       [address]       [place], [place]  [address]       Ph: [phone]       Fax: [phone]   RxID:   [id number] ARICEPT 5 MG  TABS (DONEPEZIL HCL) 1 qid  #120 x 11   Entered and Authorized by:   Judithann Sheen MD   Signed by:   Judithann Sheen MD on 09/27/2010   Method used:   Electronically to        [facility] (retail)       [address]       [place], [place]  [address]       Ph: [phone]       Fax: [phone]   RxID:   [id number]    Orders Added: 1)  Venipuncture [36415] 2)  T-Vitamin D (25-Hydroxy) [phone] 3)  UA Dipstick w/o Micro (automated)  [81003] 4)  EKG w/ Interpretation [93000] 5)  T-Blood Typing ,ABO [85462-70350] 6)  Specimen Handling [99000] 7)  Radiology Referral [Radiology] 8)  TLB-Lipid Panel [80061-LIPID] 9)  TLB-BMP (Basic Metabolic  Panel-BMET) [80048-METABOL] 10)  TLB-CBC Platelet - w/Differential [85025-CBCD] 11)  TLB-Hepatic/Liver Function Pnl [80076-HEPATIC] 12)  Prescription Created Electronically [G8553] 13)  Est. Patient Level IV [09381] 14)  Gynecologic Referral [Gyn] 15)  Prescription Created Electronically [G8553] 16)  Est. Patient Level IV [82993]    Laboratory Results   Urine Tests    Routine Urinalysis   Color: yellow Appearance: Clear Glucose: negative   (Normal Range: Negative) Bilirubin: negative   (Normal Range: Negative) Ketone: negative   (Normal Range: Negative) Spec. Gravity: 1.010   (Normal Range: 1.003-1.035) Blood: trace-lysed   (Normal Range: Negative) pH: 6.0   (Normal Range: 5.0-8.0) Protein: negative   (Normal Range: Negative) Urobilinogen: 0.2   (Normal Range: 0-1) Nitrite: negative   (Normal Range: Negative) Leukocyte Esterace: trace   (Normal Range: Negative)    Comments: Rita Ohara  September 27, 2010 1:48 PM

## 2010-11-28 NOTE — Miscellaneous (Signed)
Summary: Flu Shot/[facility]  Flu Shot/[facility]   Imported By: Maryln Gottron 11/23/2010 09:43:02  _____________________________________________________________________  External Attachment:    Type:   Image     Comment:   External Document

## 2010-12-26 ENCOUNTER — Telehealth: Payer: Self-pay | Admitting: Family Medicine

## 2010-12-26 NOTE — Telephone Encounter (Signed)
Pt wants to come in to be evaluated for cough, congestion... Offered OV with another physician but husband states pt will only see Dr Scotty Court.... Wants to come in tomorrow... Can you advise?  Pt can be reached at [phone].

## 2010-12-27 ENCOUNTER — Telehealth: Payer: Self-pay | Admitting: Family Medicine

## 2010-12-27 NOTE — Telephone Encounter (Addendum)
Pts husband called to ck on status of previous call.... Wants to come in to see Dr Scotty Court today if possible for cough, congestion... Refused to be scheduled with another physician.... Can you advise?    12/27/10 @ 3:34pm.... pts husband c/b to inquire about previous note... Offered appt with another physician again, but they refused, adv they would wait on Dr Scotty Court....  C/B # for pt: [phone] // RS

## 2010-12-31 LAB — CBC
HCT: 44.7 % (ref 36.0–46.0)
Hemoglobin: 15.8 g/dL — ABNORMAL HIGH (ref 12.0–15.0)
MCH: 31.8 pg (ref 26.0–34.0)
MCHC: 35.3 g/dL (ref 30.0–36.0)
MCV: 89.9 fL (ref 78.0–100.0)
Platelets: 156 K/uL (ref 150–400)
RBC: 4.97 MIL/uL (ref 3.87–5.11)
RDW: 12.4 % (ref 11.5–15.5)
WBC: 6.2 K/uL (ref 4.0–10.5)

## 2010-12-31 LAB — POCT I-STAT, CHEM 8
Calcium, Ion: 1.04 mmol/L — ABNORMAL LOW (ref 1.12–1.32)
Chloride: 99 mEq/L (ref 96–112)
Glucose, Bld: 124 mg/dL — ABNORMAL HIGH (ref 70–99)
HCT: 48 % — ABNORMAL HIGH (ref 36.0–46.0)
Hemoglobin: 16.3 g/dL — ABNORMAL HIGH (ref 12.0–15.0)
Potassium: 3.8 mEq/L (ref 3.5–5.1)

## 2010-12-31 LAB — URINALYSIS, ROUTINE W REFLEX MICROSCOPIC
Bilirubin Urine: NEGATIVE
Hgb urine dipstick: NEGATIVE
Ketones, ur: 15 mg/dL — AB
Protein, ur: NEGATIVE mg/dL
Urobilinogen, UA: 0.2 mg/dL (ref 0.0–1.0)

## 2010-12-31 LAB — DIFFERENTIAL
Basophils Absolute: 0 K/uL (ref 0.0–0.1)
Basophils Relative: 0 % (ref 0–1)
Eosinophils Absolute: 0.1 K/uL (ref 0.0–0.7)
Eosinophils Relative: 1 % (ref 0–5)
Lymphocytes Relative: 13 % (ref 12–46)
Lymphs Abs: 0.8 K/uL (ref 0.7–4.0)
Monocytes Absolute: 0.3 K/uL (ref 0.1–1.0)
Monocytes Relative: 5 % (ref 3–12)
Neutro Abs: 5 K/uL (ref 1.7–7.7)
Neutrophils Relative %: 81 % — ABNORMAL HIGH (ref 43–77)

## 2010-12-31 LAB — URINE MICROSCOPIC-ADD ON

## 2010-12-31 LAB — POCT CARDIAC MARKERS
CKMB, poc: <1 ng/mL — ABNORMAL LOW (ref 1.0–8.0)
Myoglobin, poc: 49 ng/mL (ref 12–200)

## 2011-01-01 ENCOUNTER — Ambulatory Visit: Payer: Self-pay | Admitting: Family Medicine

## 2011-01-01 ENCOUNTER — Telehealth: Payer: Self-pay | Admitting: Family Medicine

## 2011-01-01 ENCOUNTER — Encounter: Payer: Self-pay | Admitting: Family Medicine

## 2011-01-01 NOTE — Telephone Encounter (Signed)
Attempted to call both numbers on file a few times in regards to appointment but no one could be reached

## 2011-01-08 LAB — DIFFERENTIAL
Basophils Relative: 1 % (ref 0–1)
Eosinophils Absolute: 0.3 10*3/uL (ref 0.0–0.7)
Eosinophils Relative: 6 % — ABNORMAL HIGH (ref 0–5)
Lymphs Abs: 1.4 10*3/uL (ref 0.7–4.0)
Monocytes Relative: 7 % (ref 3–12)
Neutrophils Relative %: 52 % (ref 43–77)

## 2011-01-08 LAB — COMPREHENSIVE METABOLIC PANEL
ALT: 17 U/L (ref 0–35)
AST: 31 U/L (ref 0–37)
Alkaline Phosphatase: 60 U/L (ref 39–117)
CO2: 22 mEq/L (ref 19–32)
GFR calc Af Amer: >60 mL/min (ref >60)
GFR calc non Af Amer: >60 mL/min (ref >60)
Glucose, Bld: 98 mg/dL (ref 70–99)
Potassium: 4.2 mEq/L (ref 3.5–5.1)
Sodium: 137 mEq/L (ref 135–145)

## 2011-01-08 LAB — URINALYSIS, ROUTINE W REFLEX MICROSCOPIC
Glucose, UA: NEGATIVE mg/dL
Hgb urine dipstick: NEGATIVE
Ketones, ur: NEGATIVE mg/dL
Protein, ur: NEGATIVE mg/dL
Urobilinogen, UA: 0.2 mg/dL (ref 0.0–1.0)

## 2011-01-08 LAB — PROTIME-INR
INR: 1.05 (ref 0.00–1.49)
Prothrombin Time: 13.6 seconds (ref 11.6–15.2)

## 2011-01-08 LAB — APTT: aPTT: 29 seconds (ref 24–37)

## 2011-01-08 LAB — CBC
Hemoglobin: 16.1 g/dL — ABNORMAL HIGH (ref 12.0–15.0)
RBC: 5.1 MIL/uL (ref 3.87–5.11)
WBC: 4.2 10*3/uL (ref 4.0–10.5)

## 2011-01-08 LAB — TYPE AND SCREEN

## 2011-01-08 LAB — HEMOCCULT GUIAC POC 1CARD (OFFICE): Fecal Occult Bld: NEGATIVE

## 2011-01-22 NOTE — Telephone Encounter (Signed)
Called and gave appt.

## 2011-01-23 LAB — CARDIAC PANEL(CRET KIN+CKTOT+MB+TROPI)
CK, MB: 1.2 ng/mL (ref 0.3–4.0)
Relative Index: INVALID (ref 0.0–2.5)
Relative Index: INVALID (ref 0.0–2.5)
Troponin I: 0.01 ng/mL (ref 0.00–0.06)
Troponin I: <0.01 ng/mL (ref 0.00–0.06)

## 2011-01-23 LAB — COMPREHENSIVE METABOLIC PANEL
ALT: 15 U/L (ref 0–35)
AST: 27 U/L (ref 0–37)
Albumin: 4.1 g/dL (ref 3.5–5.2)
Alkaline Phosphatase: 58 U/L (ref 39–117)
CO2: 23 mEq/L (ref 19–32)
Chloride: 102 mEq/L (ref 96–112)
Creatinine, Ser: 0.91 mg/dL (ref 0.4–1.2)
GFR calc Af Amer: >60 mL/min (ref >60)
GFR calc non Af Amer: >60 mL/min (ref >60)
Potassium: 4 mEq/L (ref 3.5–5.1)
Sodium: 134 mEq/L — ABNORMAL LOW (ref 135–145)
Total Bilirubin: 1.1 mg/dL (ref 0.3–1.2)

## 2011-01-23 LAB — URINALYSIS, ROUTINE W REFLEX MICROSCOPIC
Bilirubin Urine: NEGATIVE
Hgb urine dipstick: NEGATIVE
Ketones, ur: 15 mg/dL — AB
Specific Gravity, Urine: 1.014 (ref 1.005–1.030)
Urobilinogen, UA: 0.2 mg/dL (ref 0.0–1.0)

## 2011-01-23 LAB — CBC
Platelets: 153 10*3/uL (ref 150–400)
RBC: 4.64 MIL/uL (ref 3.87–5.11)
WBC: 5 10*3/uL (ref 4.0–10.5)

## 2011-01-23 LAB — TSH: TSH: 2.942 u[IU]/mL (ref 0.350–4.500)

## 2011-01-23 LAB — LIPASE, BLOOD: Lipase: 53 U/L (ref 11–59)

## 2011-01-23 LAB — DIFFERENTIAL
Basophils Absolute: 0 10*3/uL (ref 0.0–0.1)
Eosinophils Absolute: 0.2 10*3/uL (ref 0.0–0.7)
Eosinophils Relative: 4 % (ref 0–5)
Lymphocytes Relative: 24 % (ref 12–46)
Monocytes Absolute: 0.2 10*3/uL (ref 0.1–1.0)

## 2011-01-23 LAB — POCT CARDIAC MARKERS
CKMB, poc: 1.1 ng/mL (ref 1.0–8.0)
Myoglobin, poc: 50 ng/mL (ref 12–200)
Troponin i, poc: <0.05 ng/mL (ref 0.00–0.09)

## 2011-01-23 LAB — URINE CULTURE: Special Requests: NEGATIVE

## 2011-01-23 LAB — URINE MICROSCOPIC-ADD ON

## 2011-01-23 LAB — PHOSPHORUS: Phosphorus: 3.6 mg/dL (ref 2.3–4.6)

## 2011-01-23 LAB — MAGNESIUM: Magnesium: 2.4 mg/dL (ref 1.5–2.5)

## 2011-03-05 NOTE — Discharge Summary (Signed)
Ebony [name], Ebony [name] NO.:  [id number]   MEDICAL RECORD NO.:  [id number]          PATIENT TYPE:  INP   LOCATION:  1603                         FACILITY:  [facility]   PHYSICIAN:  Madlyn Frankel. Charlann Boxer, M.D.  DATE OF BIRTH:  10-04-1941   DATE OF ADMISSION:  07/27/2008  DATE OF DISCHARGE:  08/02/2008                               DISCHARGE SUMMARY   ADMITTING DIAGNOSIS:  1. Hypertension.  2. Gastroesophageal reflux disease.  3. Allergies.  4. Some memory issues.   DISCHARGE DIAGNOSIS:  1. Osteoporosis, left femoral neck fracture status post left total hip      replacement.  2. Hypertension.  3. Gastroesophageal reflux disease.  4. Allergies.  5. Some memory issues.  6. Hypokalemia, resolved.   HISTORY OF PRESENT ILLNESS:  A 70 year old female walking during the  evening and fell on her left side, had immediate pain and inability to  ambulate, brought to the emergency room where x-rays revealed displaced  femoral neck fracture on left.   CONSULTANTS:  None.   PROCEDURE:  Left total hip replacement by surgeon Dr. Durene Romans.  Assistant Dwyane Luo PA.   LABORATORY DATA ON ADMISSION:  CBC showed her hematocrit to be 45.2,  platelets 172, last reading showed her white blood cell count 7.9,  hematocrit 27.8, platelets 131.  Metabolic panel upon admission showed  her sodium 129, potassium 3.3, creatinine 0.72 and glucose 113, last  reading showed her sodium improved at 134, potassium 4.7, creatinine  0.77, glucose 108.  Coags all within normal limits.  Last calcium was  8.6.  UA negative for nitrites.   RADIOLOGY:  Chest one-view showed no active disease.  Left hip two-view showed subcapital left femur fracture.  Left knee showed no acute abnormalities, some soft-tissue swelling.  Left foot 2-view no acute finding.   CARDIOLOGY:  EKG showed normal sinus rhythm.   HOSPITAL COURSE:  The patient admitted through Emergency Department  Orthopedic Service due to left  femoral neck fracture.  After discussion  with family and due to activity level, the best course of treatment was  determined to be a left total hip placement.  Underwent surgery,  admitted to orthopedic floor.  She does have a history of some mild  confusion, is on Aricept.  Her husband was with her most of the time.  She made good progress with her physical therapy; however, did have a  high fall risk as unable to maintain hip dislocation risk without  constant supervision.  She remained hemodynamically stable with  improving sodium and potassium during course of stay.  She was  weightbearing as tolerated.  We provided her with some K-Dur,  which  helped resolved hypokalemia.  Seen on day 2, no complaints, afebrile,  given Lovenox for DVT prophylaxis.  Per discussion with husband, the  best course treatment was for short-term rehab postoperatively to help  with further progress until more independent and stable.  Seen on day 3,  doing well, no acute changes, did have some mildly elevated blood  pressure.  Seen on the 12th, no significant events, dressing  change.  Later on that afternoon, she did have a little bit high blood pressure,  was put on clonidine, this helped.  The next day when we checked her  pressure it was 144 systolic.  Hemodynamically she was stable.  Physical  therapy made good progress, was ready for discharge to a nursing  facility rehab.   DISCHARGE DISPOSITION:  Discharge to skilled nurse facility rehab,  stable, improved condition.   DISCHARGE DIET:  Regular.   DISCHARGE WOUND CARE:  Keep dry.   DISCHARGE PHYSICAL THERAPY:  Weightbearing as tolerated with use of  rolling walker.   DISCHARGE MEDICATIONS:  1. Lovenox 40 mg subcutaneously q.24 through 08/11/2008.  2. Enteric-coated aspirin 325 mg 1 p.o. daily x4 weeks, start on      08/12/2008 after Lovenox completed.  3. Robaxin 500 mg 1 p.o. q.6 p.r.n. muscle spasm pain.  4. Iron 325 mg 1 p.o. t.i.d. x2  weeks.  5. Colace 100 mg 1 p.o. b.i.d. p.r.n. constipation while on narcotics.  6. MiraLax 17 g p.o. daily p.r.n. constipation.  7. Tylenol 325 mg 1-2 p.o. q.6 p.r.n. pain.  8. Avapro 150 mg 1 p.o. daily.  9. Aricept 5 mg p.o. b.i.d.  10.Zyrtec 10 mg p.o. q.p.m.  11.Nexium 40 mg p.o. daily.  12.Namenda 10 mg p.o. daily.  13.Crestor 10 mg p.o. daily.   DISCHARGE SPECIAL INSTRUCTIONS:  1. She may shower, cover the dressing on plastic, dry, redress      afterwards, do not submerge in bath.  2. Follow-up with Dr. Charlann Boxer at phone number [phone] in 2 weeks for      wound check.     ______________________________  [name]. [name], [place]      Madlyn Frankel. Charlann Boxer, M.D.  Electronically Signed    BLM/MEDQ  D:  08/02/2008  T:  08/02/2008  Job:  [id number]

## 2011-03-05 NOTE — Op Note (Signed)
NAMESANIA, NOY NO.:  [id number]   MEDICAL RECORD NO.:  [id number]          PATIENT TYPE:  INP   LOCATION:  0098                         FACILITY:  [facility]   PHYSICIAN:  Madlyn Frankel. Charlann Boxer, M.D.  DATE OF BIRTH:  13-Oct-1941   DATE OF PROCEDURE:  07/28/2008  DATE OF DISCHARGE:                               OPERATIVE REPORT   PREOPERATIVE DIAGNOSIS:  Displaced left femoral neck fracture in a 70-  year-old female.   POSTOPERATIVE DIAGNOSIS:  Displaced left femoral neck fracture in a 10-  year-old female.   PROCEDURE:  Left total hip replacement.   COMPONENTS USED:  A DePuy hip system size 50 Pinnacle cup, 36 neutral  metal liner, a size 3 standard Trilock stem with a 36 +5 ball.   SURGEON:  Madlyn Frankel. Charlann Boxer, M.D.   ASSISTANT:  [name]. Mann, PA   ANESTHESIA:  General.   BLOOD LOSS:  600 mL.   DRAINS:  Times one medium Hemovac.   COMPLICATIONS:  None.   INDICATIONS FOR PROCEDURE:  Ms. Sarnowski is a 70 year old female who was up  last night putting some trash outside when she stumbled over an acorn.  She was admitted to my service from the emergency room by my partner Dr.  Hayden Rasmussen.   I saw her this morning and reviewed with her and her husband her current  situation.  She has a medical history of some early dementia and a  little bit of confusion but lives at home independently with her  husband.  I discussed with them treatment options for the femoral neck  fracture of a hemiarthroplasty versus total hip replacement.  Total hip  replacement was chosen as an option based on the potential need for  revision surgery at some time with hemiarthroplasty onto native  acetabular cartilage.  After reviewing the risks, benefits, pros and  cons of the potential need for repeat surgery in addition to standard  risk of infection, DVT, dislocation, component failure, need for  revision surgery for any other reason, consent was obtained for  hemiarthroplasty  versus total hip replacement.   PROCEDURE IN DETAIL:  The patient was brought to the operative theater.  Once adequate anesthesia, preoperative antibiotics, Ancef administered,  the patient was positioned in the right lateral decubitus position with  left side up.  Left lower extremity was then prepped and draped in  sterile fashion.  A lateral based incision was made for posterior  approach to the hip.  Sharp dissection was carried to the iliotibial  band; iliotibial band and gluteal fascia were incised posteriorly.  Short external rotators were identified and taken down to the posterior  capsule.  I preserved the posterior capsular leaflet to protect the  sciatic nerve from retractors but also to repair anatomically at the end  of the case the superior leaflet.   The subcapital femoral neck fracture was identified and displaced.  The  femoral head was removed using the corkscrew.  Retractors were placed in  the femur and an osteotomy was made in the trochanteric fossa.  I  removed  bone fragments and debrided anything else at this point.  Attention was first directed to the femur using starting drill hand  reamer x1 and then irrigation to prevent fat emboli.  I then began  broaching with a zero broach set in anteversion at 20 degrees.  I  broached up to a size 3 which sat nicely within the metaphysis of the  femur without rotational instability.  I subsequently packed the femur  with a sponge and attended to the acetabulum.  Acetabular retractors  were placed.  I removed the labrum sharply with a long handled knife.  I  began reaming with 45 reamer and carried up to 49 reamer to get a 50 mm  cup in place.  I did this for stability with a 36 mm ball and cup.   The final 56 cup was then impacted at about 35-40 degrees of abduction  and 20 degrees of forward flexion beneath the anterior rim.  Some of the  cup exposed proximal laterally.  I placed a single cancellus screw.  Another screw  was not placed due to osteopenic bone and inappropriate  purchase.   A trial 36 neutral liner was placed and a trial reduction was then  carried out with 3 broach and 3 standard neck and 36 1.5 ball.  I was  very happy with the stability.  She tolerated internal rotation about 70-  80 degrees with neutral abduction and flexion.  She tolerated the sleep  position with abduction and internal rotation.  There was no evidence of  impingement and there was a 7 mm shuck at this point.   All trial components were removed.  I irrigated the acetabulum and  placed the hole eliminator then placed a 36 metal neutral liner.  The  final 3 standard stem was then impacted and sat level.  The broach was  retrialed at this point and I chose a 36 +5 ball for a bit more  stability and less shuck in extension.  The final 36 +5 ball was  impacted onto a dry trunnion and the hip reduced.  Reirrigated the hip  throughout the case and again at this point.  I placed a medium Hemovac  drain deep.  I reapproximated the extensor mechanism using #1 Ethibond.  I  then reapproximated the iliotibial band and gluteal fascia using #1  Vicryl.  The wound was closed with 2-0 Vicryl and running 4-0 Monocryl.  The hip was cleaned, dried and dressed sterilely with Mepilex dressing.  She was brought to the recovery room extubated tolerating the procedure  well.      Madlyn Frankel Charlann Boxer, M.D.  Electronically Signed     MDO/MEDQ  D:  07/28/2008  T:  07/28/2008  Job:  [id number]

## 2011-03-05 NOTE — Op Note (Signed)
Ebony [name], Ebony NO.:  [id number]   MEDICAL RECORD NO.:  [id number]          PATIENT TYPE:  OUT   LOCATION:  MDC                          FACILITY:  [facility]   PHYSICIAN:  Michael L. Reynolds, M.D.DATE OF BIRTH:  [birth year]   DATE OF PROCEDURE:  07/29/2006  DATE OF DISCHARGE:                                 OPERATIVE REPORT   PROCEDURE:  Diagnostic lumbar puncture.   INDICATIONS:  Memory loss and hydrocephalus.   OPERATOR:  Kelli Hope, M.D.   DESCRIPTION OF PROCEDURE:  Informed consent form was discussed with patient,  signed, and placed in the chart after the procedure, risks and benefits were  explained to the patient and her husband and they agreed to proceed.  The  patient was placed in the right lateral decubitus position and  prepped and  draped in the usual sterile fashion.  Local anesthesia was achieved with 2  cc of lidocaine.  A 20-gauge spinal needle was inserted into the L3-4  interspace and advanced until clear CSF was obtained.  Opening pressure was  measured at 180 mm of water.  A total of 20 cc of clear CSF was withdrawn  and sent for laboratory analysis as follows:   Tube #1, 4 cc:  Cell count differential.  Tube #2, 4 cc:  Glucose and protein.  Tube #3, 4 cc:  To be sent out for CSF analysis for tau and beta amyloid 42  markers for Alzheimer's disease.  Tube #4, 8 cc:  To be held in lab for further testing as need.   Closing pressure was measured at 110 mm of water.  The needle was withdrawn.  Hemostasis was obtained.  No immediate complications were noted.   The patient was given the usual post LP precautions  and advised to call  immediately or come to the emergency room should she develop fevers, stiff  neck, altered mental status, unrelenting low back pain, bowel or bladder  incontinence or weakness or numbness in the legs, and to follow up over the  next few days if she developed a postural headache.      Michael L.  Thad Ranger, M.D.  Electronically Signed     MLR/MEDQ  D:  07/29/2006  T:  07/29/2006  Job:  [id number]

## 2011-03-05 NOTE — H&P (Signed)
Ebony [name], Ebony [name] NO.:  [id number]   MEDICAL RECORD NO.:  [id number]          PATIENT TYPE:  INP   LOCATION:                               FACILITY:  [facility]   PHYSICIAN:  Madlyn Frankel. Charlann Boxer, M.D.  DATE OF BIRTH:  1941-01-07   DATE OF ADMISSION:  07/28/2008  DATE OF DISCHARGE:                              HISTORY & PHYSICAL   CHIEF COMPLAINT:  Left hip pain.   HISTORY OF PRESENT ILLNESS:  The patient is a 70 year old female who was  out walking around this evening, stepped on a chestnut causing her to  fall, landing on her left side.  The patient had pain and unable to  ambulate.  She was brought to the emergency room for evaluation.  X-rays  revealed she had a displaced femoral neck fracture on the left.  The  patient denies any shortness of breath, chest pain or loss of  consciousness.   PAST MEDICAL HISTORY:  Includes:  1. Hypertension.  2. GERD.  3. Allergies.  4. Some memory issues.   SOCIAL HISTORY:  The patient is married.  No tobacco, no alcohol.   ALLERGIES:  No known drug allergies.   CURRENT MEDICATIONS:  1. Avapro 150 mg a day.  2. Aricept 5 mg twice a day.  3. Zyrtec 10 mg every other day.  4. Nexium 20 mg a day.  5. Recently started on Namenda on a taper up-dose.  6. Crestor.   REVIEW OF SYSTEMS:  Negative for any cardiovascular issues.  No  respiratory issues.  No  neurologic issues other than some memory  issues.  No recent fevers or chills.   PHYSICAL EXAMINATION:  VITALS:  Temperature is 97.9, blood pressure is  157/114, pulse 78, respirations 18, SAT is 97% on room air.  GENERAL:  Physical exam, patient is a healthy-appearing, well-developed  female, appears to be in no distress and is lying on an emergency room  gurney.  HEENT:  Head was normocephalic.  Pupils equal, round and reactive.  Oral  buccal mucosa pink and moist.  Gross hearing is intact.  NECK:  Supple.  No palpable lymphadenopathy.  Good motion.  CHEST:  Lung sounds  were clear and equal bilaterally.  HEART:  Regular rate and rhythm.  ABDOMEN:  Soft, nontender.  EXTREMITIES:  Upper extremities had good motion without any discomfort.  LOWER EXTREMITIES: Left leg was shortened and slightly rotated.  She had  pain in the hip with motion.  Distal leg was neuromotor and vascular  intact.  SKIN:  Intact.  NEURO:  The patient was conscious, alert and appropriate at this time.   X-rays reveal she has a displaced left femoral neck fracture.  EKG was  normal sinus rhythm, about 80 beats per minute.   PLAN:  The patient will be admitted to Dr. Madlyn Frankel. Olin's service for  a planned left hip hemiarthroplasty.  The patient will get routine labs  and tests prior to this procedure.      Jamelle Rushing, P.A.      Madlyn Frankel Charlann Boxer, M.D.  Electronically Signed    RWK/MEDQ  D:  07/28/2008  T:  07/28/2008  Job:  [id number]

## 2011-03-05 NOTE — Discharge Summary (Signed)
Ebony, [name] NO.:  [id number]   MEDICAL RECORD NO.:  [id number]          PATIENT TYPE:  INP   LOCATION:  1603                         FACILITY:  [facility]   PHYSICIAN:  Madlyn Frankel. Charlann Boxer, M.D.  DATE OF BIRTH:  Apr 11, 1941   DATE OF ADMISSION:  07/27/2008  DATE OF DISCHARGE:                               DISCHARGE SUMMARY   ADDENDUM   DISCHARGE MEDICATIONS:  Change Lovenox to continue Lovenox 40 mg  subcutaneously q.24h. through August 05, 2008 and then start the  enteric coated aspirin 325 mg,  p.o. daily on August 06, 2008.     ______________________________  [name]. [name], [place]      Madlyn Frankel. Charlann Boxer, M.D.  Electronically Signed    BLM/MEDQ  D:  08/02/2008  T:  08/02/2008  Job:  [id number]

## 2011-04-17 ENCOUNTER — Ambulatory Visit (INDEPENDENT_AMBULATORY_CARE_PROVIDER_SITE_OTHER): Payer: Medicare Other | Admitting: Family Medicine

## 2011-04-17 ENCOUNTER — Encounter: Payer: Self-pay | Admitting: Family Medicine

## 2011-04-17 DIAGNOSIS — I10 Essential (primary) hypertension: Secondary | ICD-10-CM

## 2011-04-17 DIAGNOSIS — G309 Alzheimer's disease, unspecified: Secondary | ICD-10-CM

## 2011-04-17 DIAGNOSIS — F0280 Dementia in other diseases classified elsewhere without behavioral disturbance: Secondary | ICD-10-CM

## 2011-04-17 DIAGNOSIS — M129 Arthropathy, unspecified: Secondary | ICD-10-CM

## 2011-04-17 DIAGNOSIS — J310 Chronic rhinitis: Secondary | ICD-10-CM

## 2011-04-17 DIAGNOSIS — K219 Gastro-esophageal reflux disease without esophagitis: Secondary | ICD-10-CM

## 2011-04-17 DIAGNOSIS — M1990 Unspecified osteoarthritis, unspecified site: Secondary | ICD-10-CM

## 2011-04-18 ENCOUNTER — Ambulatory Visit: Payer: Self-pay | Admitting: Family Medicine

## 2011-04-22 ENCOUNTER — Encounter: Payer: Self-pay | Admitting: Family Medicine

## 2011-04-22 NOTE — Progress Notes (Signed)
  Subjective:    Patient ID: Ebony [name], female    DOB: 23-Oct-1940, 70 y.o.   MRN: [id number] This 70 year old white married female is in today at the  request of Dr. Lucina Mellow Heidi,Neurologist at [facility], short Ebony [name]. May 8 and gave her a very thorough complete medical evaluation and especiallyneurologically however on the day of the examination the blood pressure was taken numerous times and was elevated 160+ and also the diastolic 90-100,, but then the blood pressure would become normal but not to the point of being cause of orthostatic syncope She was seen again on 6/26 and the pressure was 170/105. The patient continues to have GERD and reflux causing coughing at night so the medication was changed from Prilosec to protonix, and Dr. Trudie [name] recommended to cut down on the cough medication at night of interested she did testing did demonstrate that the patient does have Alzheimer's continue Aricept 5 mg q.i.d. As well as Namenda 10 mg b.i.d. The patient was noted to be mildly depressed it was recommended that she retry the sertraline and she added Wellbutrin to her list patient continues to have problem with nasal and sinus congestion and has been continued on Fexofenadine and Nasonex nasal Sprays Arthritis controlled with Voltaren 1% gel applied to hands fingers and feet Time 45-60 minutes in reviewing the records from [facility] and discussed in the problems with the husband      HPI    Review of Systems see history of present illness    Objective:   Physical Exam date patient is well well nourished white female who appears only depressed but in no acute distress blood pressure was 120/88 on  Arrival and then after some discussion and the patient was somewhat excited the blood pressure was 160/94 and then repeated and was 134/88 Heart and lungs reveal no abnormalities No peripheral edema Neurological exam limited the patient was alert talkative cooperative and aware  of her  environment The dependent somewhat on her husband to answer many questions          Assessment & Plan:  Essential hypertension is present but since she has had short episodes of elevated blood pressure we have agreed to leave medications as they are at this time with the Avapro and continue to collect data as far as blood pressure reading and I will keep in touch, consider adding low dose Bystolic after reviewing the information regarding this medication Alzheimer's disease continue Aricept and Namenda GERD to continue protonix 40 mg each day and an appt  will be made with a gastroenterologist At [facility] Arthritis to continue using Voltaren Gel

## 2011-04-22 NOTE — Patient Instructions (Signed)
I am very much impressed with the complete evaluation and medical workup that you received from Dr. Trudie Buckler As we know Ebony [name]'s blood pressure is very labile and we will keep a record  Her pressures as you have been doing and continue same medication until we discuss the problem again guarding having another medication it appears that blood pressure elevation is a very short duration I will call within a week

## 2011-05-08 LAB — HM MAMMOGRAPHY

## 2011-05-17 ENCOUNTER — Encounter: Payer: Self-pay | Admitting: Family Medicine

## 2011-05-22 ENCOUNTER — Encounter: Payer: Self-pay | Admitting: Family Medicine

## 2011-06-16 IMAGING — CT CT HEAD W/O CM
1 of 3 series · 15 of 30 positions shown, 19 images · non-contrast
Comparison: 09/11/2009.

CLINICAL DATA: 69-year-old female with syncope, weakness.

CT HEAD WITHOUT CONTRAST
TECHNIQUE: Contiguous axial images were obtained from the base of
the skull through the vertex without contrast.

[Series 2: head_seq 4.5 h37s st · axial · 0.43mm/px · z∈[+1040,+1184]mm · 15 of 36 slices shown, 19 images]
[im 2/36  brain]
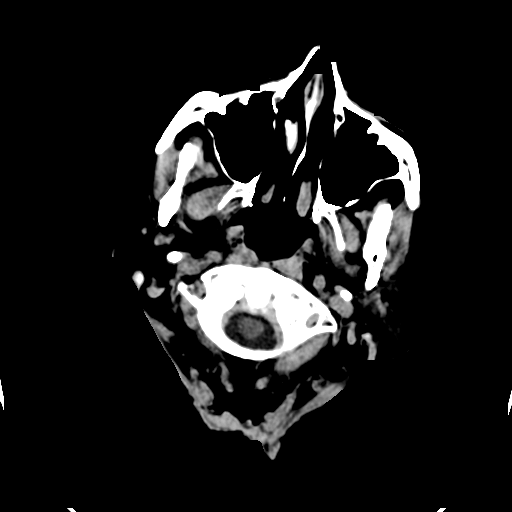
[im 2/36  bone]
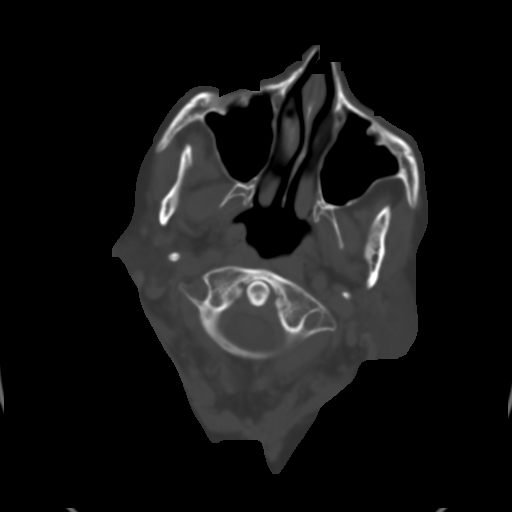
[im 4/36  brain]
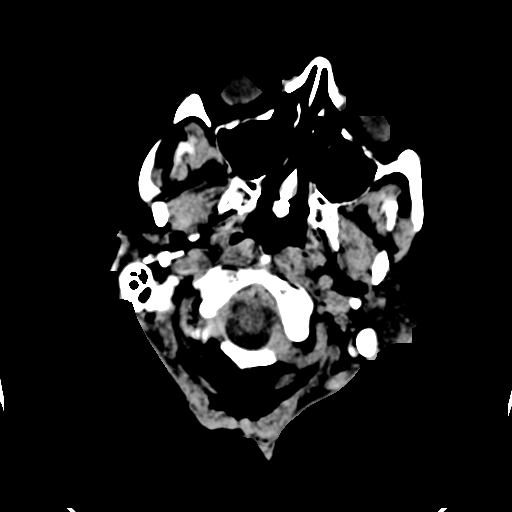
[im 8/36  brain]
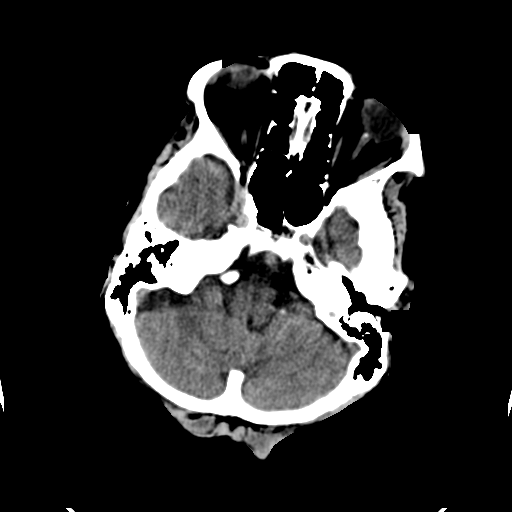
[im 10/36  brain]
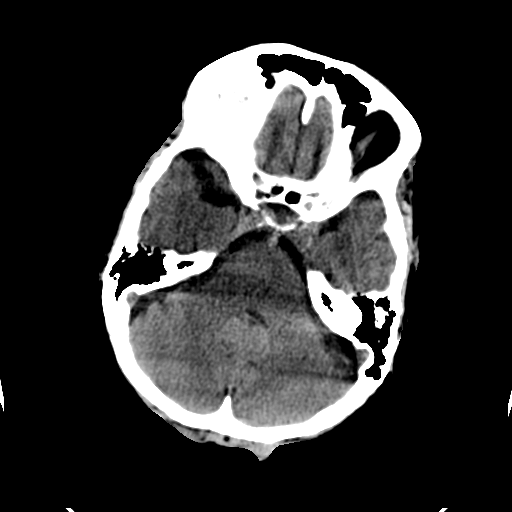
[im 12/36  brain]
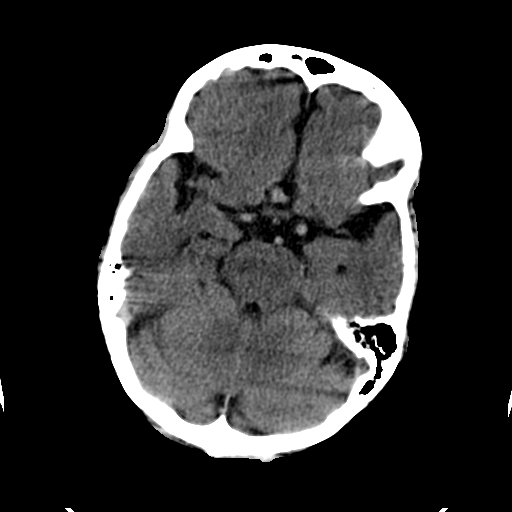
[im 12/36  bone]
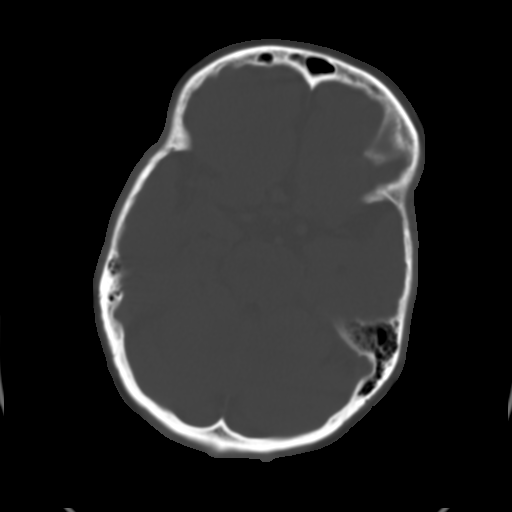
[im 13/36  brain]
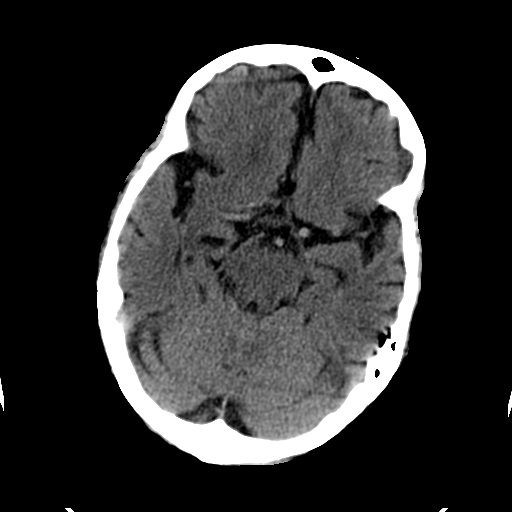
[im 15/36  brain]
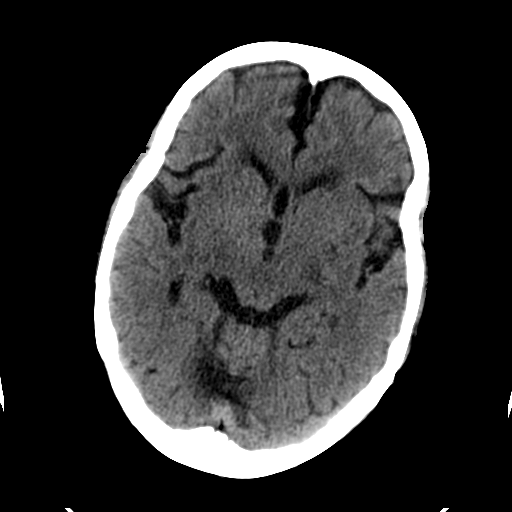
[im 19/36  brain]
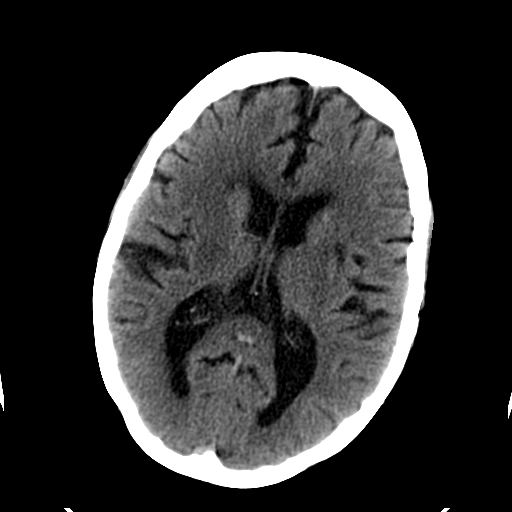
[im 21/36  brain]
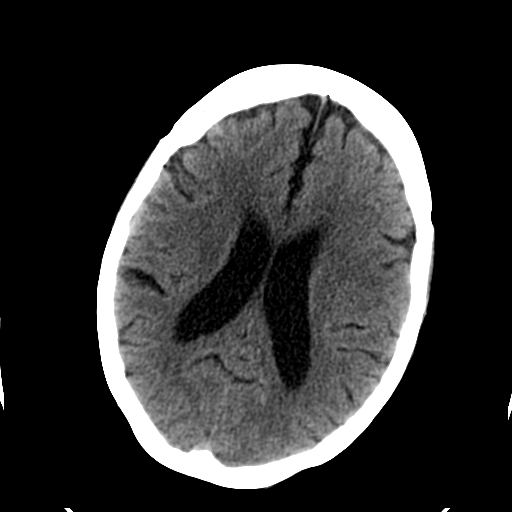
[im 21/36  bone]
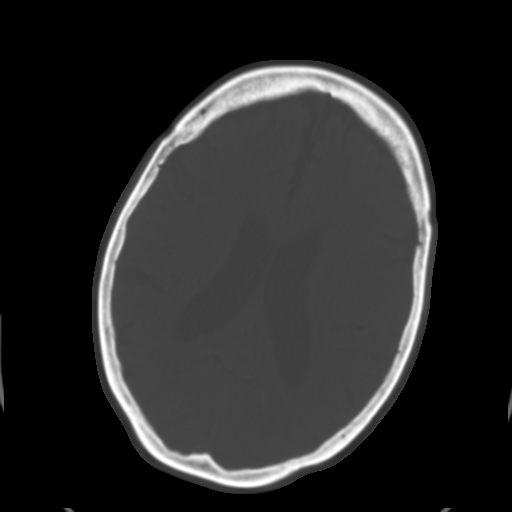
[im 23/36  brain]
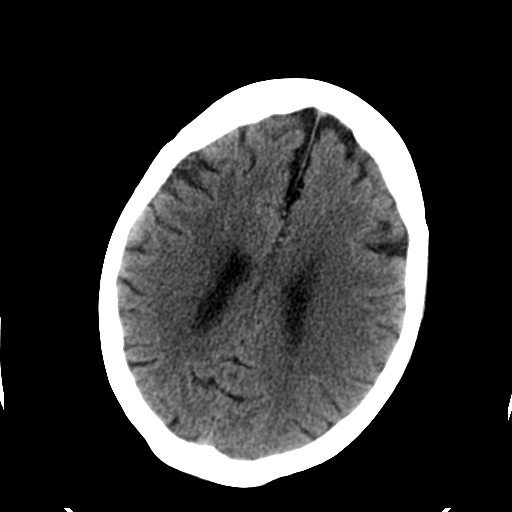
[im 24/36  brain]
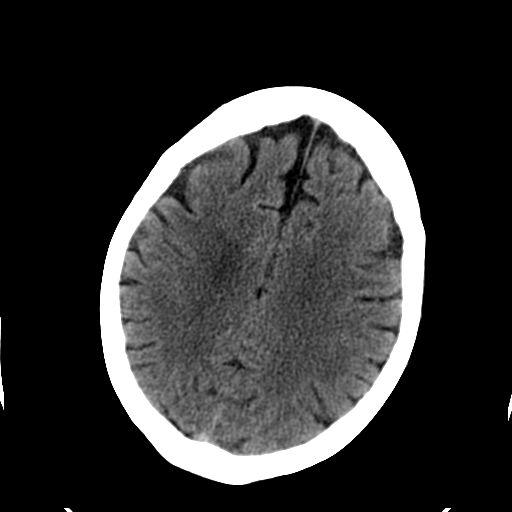
[im 26/36  brain]
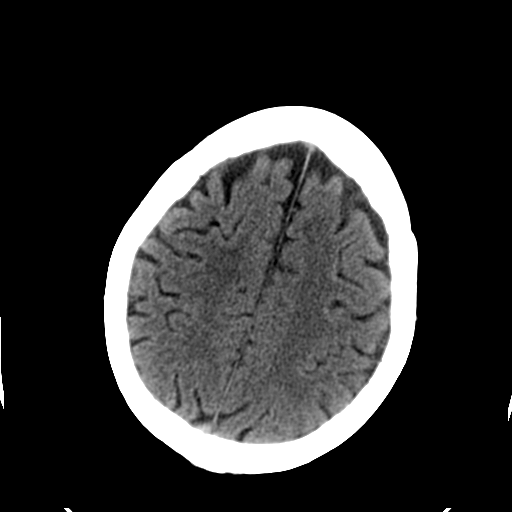
[im 30/36  brain]
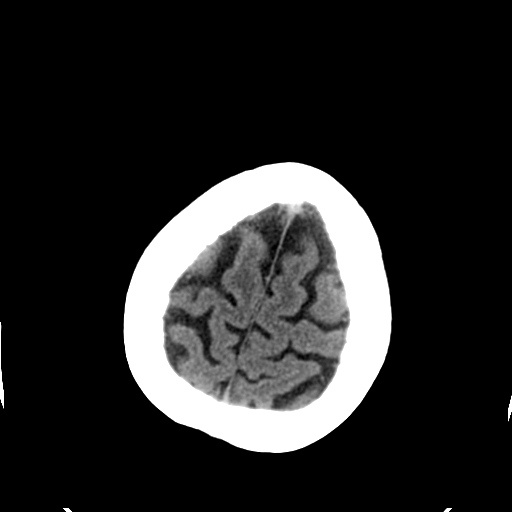
[im 30/36  bone]
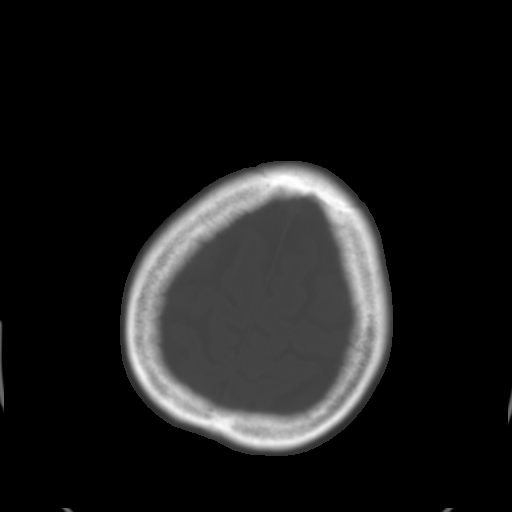
[im 32/36  brain]
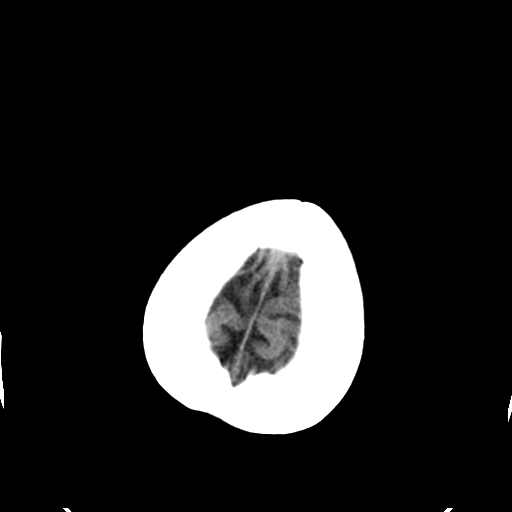
[im 34/36  brain]
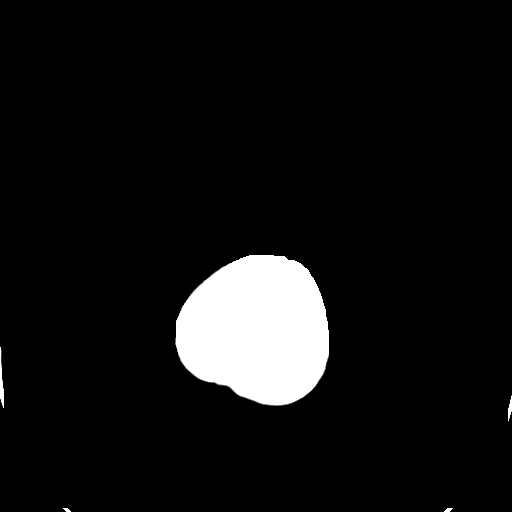

[15 of 30 positions shown; findings below may reference images not displayed]

FINDINGS: Visualized orbits and scalp soft tissues are within
normal limits.  No acute osseous abnormality identified.
Visualized paranasal sinuses and mastoids are clear.

Mild calcified atherosclerosis at the skull base.  Stable cerebral
volume.  No ventriculomegaly.  Bilateral patchy cerebral  white
matter hypodensity is new/progressed.  This includes a chronic
lacunar infarct radiating to the left external capsule.  No
evidence of cortically based acute infarction identified.  No acute
intracranial hemorrhage identified.  No suspicious intracranial
vascular hyperdensity.
IMPRESSION: No acute intracranial abnormality.  Progression of chronic small
vessel ischemia since August 2009.

## 2011-06-18 ENCOUNTER — Telehealth: Payer: Self-pay

## 2011-06-18 MED ORDER — NEBIVOLOL HCL 5 MG PO TABS: 5.0000 mg | ORAL_TABLET | Freq: Every day | ORAL | Status: DC

## 2011-06-18 NOTE — Telephone Encounter (Signed)
Pt's husband called and stated that pt was given bystolic 5 mg samples at last visit and they are working.  Pt would like a rx called in to pharmacy.    Ok per Dr. Scotty Court to call in bystolic 5 mg.  Pt is aware that rx has been sent to pharmacy.

## 2011-07-23 LAB — POCT I-STAT, CHEM 8
Calcium, Ion: 1.15
Chloride: 108
HCT: 44
Sodium: 140
TCO2: 20

## 2011-07-23 LAB — BASIC METABOLIC PANEL
BUN: 4 — ABNORMAL LOW
CO2: 25
Calcium: 7.9 — ABNORMAL LOW
Calcium: 8.6
Chloride: 105
Creatinine, Ser: 0.72
Creatinine, Ser: 0.77
GFR calc Af Amer: >60
GFR calc Af Amer: >60
Sodium: 134 — ABNORMAL LOW

## 2011-07-23 LAB — CBC
Hemoglobin: 9.7 — ABNORMAL LOW
MCHC: 34.3
MCHC: 34.8
MCV: 92.5
Platelets: 131 — ABNORMAL LOW
Platelets: 172
RBC: 3.03 — ABNORMAL LOW
RBC: 3.53 — ABNORMAL LOW
RDW: 12.8
WBC: 5.8
WBC: 7.9

## 2011-07-23 LAB — URINALYSIS, ROUTINE W REFLEX MICROSCOPIC
Bilirubin Urine: NEGATIVE
Nitrite: NEGATIVE
Urobilinogen, UA: 0.2

## 2011-07-23 LAB — ABO/RH: ABO/RH(D): A POS

## 2011-07-23 LAB — DIFFERENTIAL
Basophils Absolute: 0
Basophils Relative: 0
Eosinophils Relative: 0
Lymphocytes Relative: 8 — ABNORMAL LOW
Neutro Abs: 7.9 — ABNORMAL HIGH

## 2011-07-23 LAB — HEMOGLOBIN AND HEMATOCRIT, BLOOD: HCT: 34.8 — ABNORMAL LOW

## 2011-07-23 LAB — PROTIME-INR: Prothrombin Time: 14.5

## 2011-07-23 LAB — TYPE AND SCREEN
ABO/RH(D): A POS
Antibody Screen: NEGATIVE

## 2011-07-23 LAB — APTT: aPTT: 29

## 2011-09-29 ENCOUNTER — Other Ambulatory Visit: Payer: Self-pay | Admitting: Family Medicine

## 2011-09-30 NOTE — Telephone Encounter (Signed)
Pt aware of need to establish with another PCP 

## 2014-01-24 ENCOUNTER — Inpatient Hospital Stay (HOSPITAL_COMMUNITY)
Admission: EM | Admit: 2014-01-24 | Discharge: 2014-01-28 | DRG: 682 | Disposition: A | Discharge status: Skilled Nursing Facility | Payer: Medicare Other | Attending: Internal Medicine | Admitting: Internal Medicine

## 2014-01-24 ENCOUNTER — Encounter (HOSPITAL_COMMUNITY): Payer: Self-pay | Admitting: Emergency Medicine

## 2014-01-24 ENCOUNTER — Emergency Department (HOSPITAL_COMMUNITY): Payer: Medicare Other

## 2014-01-24 ENCOUNTER — Inpatient Hospital Stay (HOSPITAL_COMMUNITY): Payer: Medicare Other

## 2014-01-24 DIAGNOSIS — R5383 Other fatigue: Secondary | ICD-10-CM

## 2014-01-24 DIAGNOSIS — M129 Arthropathy, unspecified: Secondary | ICD-10-CM | POA: Diagnosis present

## 2014-01-24 DIAGNOSIS — R259 Unspecified abnormal involuntary movements: Secondary | ICD-10-CM | POA: Diagnosis present

## 2014-01-24 DIAGNOSIS — IMO0002 Reserved for concepts with insufficient information to code with codable children: Secondary | ICD-10-CM

## 2014-01-24 DIAGNOSIS — E860 Dehydration: Secondary | ICD-10-CM

## 2014-01-24 DIAGNOSIS — N189 Chronic kidney disease, unspecified: Secondary | ICD-10-CM | POA: Diagnosis present

## 2014-01-24 DIAGNOSIS — A419 Sepsis, unspecified organism: Secondary | ICD-10-CM

## 2014-01-24 DIAGNOSIS — I10 Essential (primary) hypertension: Secondary | ICD-10-CM | POA: Diagnosis present

## 2014-01-24 DIAGNOSIS — I472 Ventricular tachycardia, unspecified: Secondary | ICD-10-CM | POA: Diagnosis present

## 2014-01-24 DIAGNOSIS — E785 Hyperlipidemia, unspecified: Secondary | ICD-10-CM | POA: Diagnosis present

## 2014-01-24 DIAGNOSIS — G9341 Metabolic encephalopathy: Secondary | ICD-10-CM | POA: Diagnosis present

## 2014-01-24 DIAGNOSIS — Z96649 Presence of unspecified artificial hip joint: Secondary | ICD-10-CM

## 2014-01-24 DIAGNOSIS — N19 Unspecified kidney failure: Secondary | ICD-10-CM

## 2014-01-24 DIAGNOSIS — N179 Acute kidney failure, unspecified: Principal | ICD-10-CM | POA: Diagnosis present

## 2014-01-24 DIAGNOSIS — M549 Dorsalgia, unspecified: Secondary | ICD-10-CM

## 2014-01-24 DIAGNOSIS — M810 Age-related osteoporosis without current pathological fracture: Secondary | ICD-10-CM | POA: Diagnosis present

## 2014-01-24 DIAGNOSIS — R001 Bradycardia, unspecified: Secondary | ICD-10-CM

## 2014-01-24 DIAGNOSIS — F329 Major depressive disorder, single episode, unspecified: Secondary | ICD-10-CM | POA: Diagnosis present

## 2014-01-24 DIAGNOSIS — Z79899 Other long term (current) drug therapy: Secondary | ICD-10-CM

## 2014-01-24 DIAGNOSIS — A0472 Enterocolitis due to Clostridium difficile, not specified as recurrent: Secondary | ICD-10-CM

## 2014-01-24 DIAGNOSIS — R5381 Other malaise: Secondary | ICD-10-CM | POA: Diagnosis present

## 2014-01-24 DIAGNOSIS — I129 Hypertensive chronic kidney disease with stage 1 through stage 4 chronic kidney disease, or unspecified chronic kidney disease: Secondary | ICD-10-CM | POA: Diagnosis present

## 2014-01-24 DIAGNOSIS — N12 Tubulo-interstitial nephritis, not specified as acute or chronic: Secondary | ICD-10-CM

## 2014-01-24 DIAGNOSIS — G9340 Encephalopathy, unspecified: Secondary | ICD-10-CM | POA: Diagnosis present

## 2014-01-24 DIAGNOSIS — I495 Sick sinus syndrome: Secondary | ICD-10-CM

## 2014-01-24 DIAGNOSIS — E870 Hyperosmolality and hypernatremia: Secondary | ICD-10-CM | POA: Diagnosis present

## 2014-01-24 DIAGNOSIS — G309 Alzheimer's disease, unspecified: Secondary | ICD-10-CM | POA: Diagnosis present

## 2014-01-24 DIAGNOSIS — R6520 Severe sepsis without septic shock: Secondary | ICD-10-CM

## 2014-01-24 DIAGNOSIS — E876 Hypokalemia: Secondary | ICD-10-CM

## 2014-01-24 DIAGNOSIS — R569 Unspecified convulsions: Secondary | ICD-10-CM | POA: Diagnosis present

## 2014-01-24 DIAGNOSIS — G253 Myoclonus: Secondary | ICD-10-CM | POA: Diagnosis present

## 2014-01-24 DIAGNOSIS — F0280 Dementia in other diseases classified elsewhere without behavioral disturbance: Secondary | ICD-10-CM | POA: Diagnosis present

## 2014-01-24 DIAGNOSIS — N390 Urinary tract infection, site not specified: Secondary | ICD-10-CM

## 2014-01-24 DIAGNOSIS — J441 Chronic obstructive pulmonary disease with (acute) exacerbation: Secondary | ICD-10-CM | POA: Diagnosis present

## 2014-01-24 DIAGNOSIS — I959 Hypotension, unspecified: Secondary | ICD-10-CM | POA: Diagnosis present

## 2014-01-24 DIAGNOSIS — T4275XA Adverse effect of unspecified antiepileptic and sedative-hypnotic drugs, initial encounter: Secondary | ICD-10-CM | POA: Diagnosis present

## 2014-01-24 DIAGNOSIS — I4729 Other ventricular tachycardia: Secondary | ICD-10-CM | POA: Diagnosis present

## 2014-01-24 DIAGNOSIS — F3289 Other specified depressive episodes: Secondary | ICD-10-CM | POA: Diagnosis present

## 2014-01-24 DIAGNOSIS — K219 Gastro-esophageal reflux disease without esophagitis: Secondary | ICD-10-CM | POA: Diagnosis present

## 2014-01-24 DIAGNOSIS — E669 Obesity, unspecified: Secondary | ICD-10-CM | POA: Diagnosis present

## 2014-01-24 HISTORY — DX: Hyperlipidemia, unspecified: E78.5

## 2014-01-24 HISTORY — DX: Major depressive disorder, single episode, unspecified: F32.9

## 2014-01-24 HISTORY — DX: Age-related osteoporosis without current pathological fracture: M81.0

## 2014-01-24 HISTORY — DX: Depression, unspecified: F32.A

## 2014-01-24 LAB — CBC WITH DIFFERENTIAL/PLATELET
BASOS ABS: 0 10*3/uL (ref 0.0–0.1)
BASOS ABS: 0 10*3/uL (ref 0.0–0.1)
BASOS PCT: 0 % (ref 0–1)
BASOS PCT: 0 % (ref 0–1)
EOS ABS: 0.1 10*3/uL (ref 0.0–0.7)
EOS PCT: 1 % (ref 0–5)
Eosinophils Absolute: 0.1 10*3/uL (ref 0.0–0.7)
Eosinophils Relative: 1 % (ref 0–5)
HCT: 45.2 % (ref 36.0–46.0)
HCT: 39.2 % (ref 36.0–46.0)
HEMOGLOBIN: 14.8 g/dL (ref 12.0–15.0)
Hemoglobin: 12.6 g/dL (ref 12.0–15.0)
LYMPHS ABS: 2.1 10*3/uL (ref 0.7–4.0)
Lymphocytes Relative: 19 % (ref 12–46)
Lymphocytes Relative: 21 % (ref 12–46)
Lymphs Abs: 2.3 10*3/uL (ref 0.7–4.0)
MCH: 32.6 pg (ref 26.0–34.0)
MCH: 32.1 pg (ref 26.0–34.0)
MCHC: 32.7 g/dL (ref 30.0–36.0)
MCHC: 32.1 g/dL (ref 30.0–36.0)
MCV: 99.6 fL (ref 78.0–100.0)
MCV: 100 fL (ref 78.0–100.0)
Monocytes Absolute: 1.3 10*3/uL — ABNORMAL HIGH (ref 0.1–1.0)
Monocytes Absolute: 0.9 10*3/uL (ref 0.1–1.0)
Monocytes Relative: 11 % (ref 3–12)
Monocytes Relative: 9 % (ref 3–12)
NEUTROS ABS: 8.6 10*3/uL — AB (ref 1.7–7.7)
NEUTROS PCT: 70 % (ref 43–77)
NEUTROS PCT: 70 % (ref 43–77)
Neutro Abs: 7.1 10*3/uL (ref 1.7–7.7)
PLATELETS: 180 10*3/uL (ref 150–400)
PLATELETS: 155 10*3/uL (ref 150–400)
RBC: 4.54 MIL/uL (ref 3.87–5.11)
RBC: 3.92 MIL/uL (ref 3.87–5.11)
RDW: 13.7 % (ref 11.5–15.5)
RDW: 13.7 % (ref 11.5–15.5)
WBC: 12.3 10*3/uL — ABNORMAL HIGH (ref 4.0–10.5)
WBC: 10.2 10*3/uL (ref 4.0–10.5)

## 2014-01-24 LAB — COMPREHENSIVE METABOLIC PANEL
ALBUMIN: 3.7 g/dL (ref 3.5–5.2)
ALK PHOS: 87 U/L (ref 39–117)
ALK PHOS: 71 U/L (ref 39–117)
ALT: 21 U/L (ref 0–35)
ALT: 18 U/L (ref 0–35)
AST: 31 U/L (ref 0–37)
AST: 30 U/L (ref 0–37)
Albumin: 3 g/dL — ABNORMAL LOW (ref 3.5–5.2)
BILIRUBIN TOTAL: 0.5 mg/dL (ref 0.3–1.2)
BILIRUBIN TOTAL: 0.4 mg/dL (ref 0.3–1.2)
BUN: 81 mg/dL — ABNORMAL HIGH (ref 6–23)
BUN: 64 mg/dL — AB (ref 6–23)
CHLORIDE: 113 meq/L — AB (ref 96–112)
CHLORIDE: 120 meq/L — AB (ref 96–112)
CO2: 22 mEq/L (ref 19–32)
CO2: 21 mEq/L (ref 19–32)
CREATININE: 2.21 mg/dL — AB (ref 0.50–1.10)
Calcium: 10.3 mg/dL (ref 8.4–10.5)
Calcium: 8.3 mg/dL — ABNORMAL LOW (ref 8.4–10.5)
Creatinine, Ser: 3.3 mg/dL — ABNORMAL HIGH (ref 0.50–1.10)
GFR calc Af Amer: 15 mL/min — ABNORMAL LOW (ref >90)
GFR calc Af Amer: 24 mL/min — ABNORMAL LOW (ref >90)
GFR calc non Af Amer: 13 mL/min — ABNORMAL LOW (ref >90)
GFR, EST NON AFRICAN AMERICAN: 21 mL/min — AB (ref >90)
Glucose, Bld: 106 mg/dL — ABNORMAL HIGH (ref 70–99)
Glucose, Bld: 94 mg/dL (ref 70–99)
POTASSIUM: 4.5 meq/L (ref 3.7–5.3)
POTASSIUM: 4.1 meq/L (ref 3.7–5.3)
SODIUM: 153 meq/L — AB (ref 137–147)
Sodium: 154 mEq/L — ABNORMAL HIGH (ref 137–147)
Total Protein: 7.1 g/dL (ref 6.0–8.3)
Total Protein: 5.9 g/dL — ABNORMAL LOW (ref 6.0–8.3)

## 2014-01-24 LAB — BLOOD GAS, VENOUS
ACID-BASE EXCESS: 1.2 mmol/L (ref 0.0–2.0)
Bicarbonate: 26.7 mEq/L — ABNORMAL HIGH (ref 20.0–24.0)
O2 SAT: 38.9 %
PATIENT TEMPERATURE: 98.6
PH VEN: 7.364 — AB (ref 7.250–7.300)
TCO2: 23.9 mmol/L (ref 0–100)
pCO2, Ven: 48.1 mmHg (ref 45.0–50.0)
pO2, Ven: 0 mmHg — CL (ref 30.0–45.0)

## 2014-01-24 LAB — MRSA PCR SCREENING: MRSA BY PCR: NEGATIVE

## 2014-01-24 LAB — LIPID PANEL
Cholesterol: 126 mg/dL (ref 0–200)
HDL: 57 mg/dL (ref >39)
LDL Cholesterol: 54 mg/dL (ref 0–99)
TRIGLYCERIDES: 76 mg/dL (ref <150)
Total CHOL/HDL Ratio: 2.2 RATIO
VLDL: 15 mg/dL (ref 0–40)

## 2014-01-24 LAB — URINE MICROSCOPIC-ADD ON

## 2014-01-24 LAB — URINALYSIS, ROUTINE W REFLEX MICROSCOPIC
GLUCOSE, UA: NEGATIVE mg/dL
Hgb urine dipstick: NEGATIVE
KETONES UR: NEGATIVE mg/dL
Nitrite: NEGATIVE
PH: 5 (ref 5.0–8.0)
Protein, ur: 30 mg/dL — AB
SPECIFIC GRAVITY, URINE: 1.03 (ref 1.005–1.030)
Urobilinogen, UA: 0.2 mg/dL (ref 0.0–1.0)

## 2014-01-24 LAB — HEMOGLOBIN A1C
HEMOGLOBIN A1C: 5.6 % (ref <5.7)
Mean Plasma Glucose: 114 mg/dL (ref <117)

## 2014-01-24 LAB — CLOSTRIDIUM DIFFICILE BY PCR: CDIFFPCR: POSITIVE — AB

## 2014-01-24 LAB — I-STAT CG4 LACTIC ACID, ED: Lactic Acid, Venous: 1.59 mmol/L (ref 0.5–2.2)

## 2014-01-24 LAB — TROPONIN I: Troponin I: <0.3 ng/mL (ref <0.30)

## 2014-01-24 LAB — TSH: TSH: 1.08 u[IU]/mL (ref 0.350–4.500)

## 2014-01-24 MED ORDER — SODIUM CHLORIDE 0.9 % IV SOLN: Freq: Once | INTRAVENOUS | Status: DC

## 2014-01-24 MED ORDER — VANCOMYCIN HCL IN DEXTROSE 750-5 MG/150ML-% IV SOLN: 750.0000 mg | INTRAVENOUS | Status: DC

## 2014-01-24 MED ORDER — ENOXAPARIN SODIUM 40 MG/0.4ML ~~LOC~~ SOLN
40.0000 mg | SUBCUTANEOUS | Status: DC
  Administered 2014-01-24: 40 mg via SUBCUTANEOUS
  Filled 2014-01-24: qty 0.4

## 2014-01-24 MED ORDER — FLUTICASONE PROPIONATE 50 MCG/ACT NA SUSP
1.0000 | Freq: Every day | NASAL | Status: DC
  Administered 2014-01-25 – 2014-01-27 (×3): 1 via NASAL
  Filled 2014-01-24: qty 16

## 2014-01-24 MED ORDER — SODIUM CHLORIDE 0.9 % IV BOLUS (SEPSIS)
1000.0000 mL | Freq: Once | INTRAVENOUS | Status: AC
  Administered 2014-01-24: 1000 mL via INTRAVENOUS

## 2014-01-24 MED ORDER — CEFEPIME HCL 2 G IJ SOLR
2.0000 g | Freq: Two times a day (BID) | INTRAMUSCULAR | Status: AC
  Administered 2014-01-24: 2 g via INTRAVENOUS
  Filled 2014-01-24 (×3): qty 2

## 2014-01-24 MED ORDER — SODIUM CHLORIDE 0.9 % IV SOLN
100.0000 mg | Freq: Two times a day (BID) | INTRAVENOUS | Status: DC
  Administered 2014-01-24 – 2014-01-25 (×3): 100 mg via INTRAVENOUS
  Filled 2014-01-24 (×5): qty 10

## 2014-01-24 MED ORDER — METRONIDAZOLE 500 MG PO TABS
500.0000 mg | ORAL_TABLET | Freq: Three times a day (TID) | ORAL | Status: DC
  Administered 2014-01-24 – 2014-01-28 (×13): 500 mg via ORAL
  Filled 2014-01-24 (×16): qty 1

## 2014-01-24 MED ORDER — DEXTROSE 5 % IV SOLN
INTRAVENOUS | Status: AC
  Administered 2014-01-24: 100 mL via INTRAVENOUS
  Administered 2014-01-24 – 2014-01-25 (×2): via INTRAVENOUS

## 2014-01-24 MED ORDER — DEXTROSE 5 % IV SOLN
1.0000 g | INTRAVENOUS | Status: DC
  Administered 2014-01-25: 1 g via INTRAVENOUS
  Filled 2014-01-24: qty 1

## 2014-01-24 MED ORDER — SODIUM CHLORIDE 0.9 % IV BOLUS (SEPSIS)
2000.0000 mL | Freq: Once | INTRAVENOUS | Status: AC
  Administered 2014-01-24: 2000 mL via INTRAVENOUS

## 2014-01-24 MED ORDER — VANCOMYCIN HCL IN DEXTROSE 1-5 GM/200ML-% IV SOLN
1000.0000 mg | Freq: Once | INTRAVENOUS | Status: AC
  Administered 2014-01-24: 1000 mg via INTRAVENOUS
  Filled 2014-01-24: qty 200

## 2014-01-24 MED ORDER — ENOXAPARIN SODIUM 30 MG/0.3ML ~~LOC~~ SOLN
30.0000 mg | SUBCUTANEOUS | Status: DC
Start: 2014-01-25 — End: 2014-01-28
  Administered 2014-01-25 – 2014-01-28 (×4): 30 mg via SUBCUTANEOUS
  Filled 2014-01-24 (×4): qty 0.3

## 2014-01-24 NOTE — Progress Notes (Signed)
Patient seen and evaluated earlier this AM by my associate. Please refer to his H and P for details regarding assessment and plan.  Will reassess next am.  Pt with acute encephalopathy presumed to be 2ary to metabolic causes (dehydration and renal failure) as well as infectious etiology with possible UTI on vancomycin and cefepime.  Arthor Gorter, [employer]

## 2014-01-24 NOTE — Progress Notes (Signed)
ANTIBIOTIC CONSULT NOTE - INITIAL  Pharmacy Consult for Vancomycin, cefepime  Indication: UTI, sepsis  Allergies  Allergen Reactions  . Amitriptyline     Per mar  . Wellbutrin [Bupropion]     Per mar    Patient Measurements: Height: 5' (152.4 cm) Weight: 125 lb (56.7 kg) IBW/kg (Calculated) : 45.5 Adjusted Body Weight:   Vital Signs: Temp: 99.8 F (37.7 C) (04/06 0133) Temp src: Rectal (04/06 0133) BP: 159/62 mmHg (04/06 0332) Pulse Rate: 67 (04/06 0332) Intake/Output from previous day:   Intake/Output from this shift:    Labs:  Recent Labs  01/24/14 0115  WBC 12.3*  HGB 14.8  PLT 180  CREATININE 3.30*   Estimated Creatinine Clearance: 11.8 ml/min (by C-G formula based on Cr of 3.3). No results found for this basename: VANCOTROUGH, VANCOPEAK, VANCORANDOM, GENTTROUGH, GENTPEAK, GENTRANDOM, TOBRATROUGH, TOBRAPEAK, TOBRARND, AMIKACINPEAK, AMIKACINTROU, AMIKACIN,  in the last 72 hours   Microbiology: No results found for this or any previous visit (from the past 720 hour(s)).  Medical History: Past Medical History  Diagnosis Date  . GERD (gastroesophageal reflux disease)   . Hypertension   . Chest pain, unspecified   . Alzheimer's dementia   . Chronic arthritis   . Postmenopausal   . Seizures   . Depression   . Hyperlipidemia   . Osteoporosis     Medications:  Anti-infectives   Start     Dose/Rate Route Frequency Ordered Stop   01/26/14 0600  vancomycin (VANCOCIN) IVPB 750 mg/150 ml premix     750 mg 150 mL/hr over 60 Minutes Intravenous Every 48 hours 01/24/14 0624     01/25/14 0800  ceFEPIme (MAXIPIME) 1 g in dextrose 5 % 50 mL IVPB     1 g 100 mL/hr over 30 Minutes Intravenous Every 24 hours 01/24/14 0647     01/24/14 0615  vancomycin (VANCOCIN) IVPB 1000 mg/200 mL premix     1,000 mg 200 mL/hr over 60 Minutes Intravenous  Once 01/24/14 0607     01/24/14 0300  ceFEPIme (MAXIPIME) 2 g in dextrose 5 % 50 mL IVPB     2 g 100 mL/hr over 30 Minutes  Intravenous Every 12 hours 01/24/14 0245 01/24/14 0959     Assessment: Patient with UTI, sepsis.  First dose of antibiotics already sent to floor. Patient with poor renal function.  Goal of Therapy:  Vancomycin trough level 15-20 mcg/ml  Plan:  Measure antibiotic drug levels at steady state Follow up culture results Vancomycin 750mg  iv q48hr after 1gm load Cefepime 1gm iv q24hr, after 2gm load.  Darlina GuysGrimsley Jr, Jacquenette ShoneJulian Crowford 01/24/2014,6:47 AM

## 2014-01-24 NOTE — ED Notes (Addendum)
Per EMS report[facility] [place]: Staff reports pt has not been drinking and had N/V since all day yesterday.  Pt hx of dementia.  Pt normally has tremors but is more pronounced. Pt alert. Skin warm and dry but does exhibit some skin tenting. Pt put on rocephin yesterday.

## 2014-01-24 NOTE — ED Notes (Signed)
Pt's husband called to check on pt.  Pt's husband states that pt is normally able to hold a conversation and ambulate independently.

## 2014-01-24 NOTE — ED Notes (Signed)
Patient transported to CT 

## 2014-01-24 NOTE — Clinical Social Work Psychosocial (Signed)
     Clinical Social Work Department BRIEF PSYCHOSOCIAL ASSESSMENT 01/24/2014  Patient:  Ebony [name], Ebony [name]     Account Number:  [id number]     Admit date:  01/24/2014  Clinical Social Worker:  Venia Minks  Date/Time:  01/24/2014 12:00 M  Referred by:  Physician  Date Referred:  01/24/2014 Referred for  SNF Placement   Other Referral:   Interview type:  Family Other interview type:    PSYCHOSOCIAL DATA Living Status:  FACILITY Admitted from facility:  [facility] Level of care:  [place] Primary support name:  Ebony [name] Primary support relationship to patient:  SPOUSE Degree of support available:   good    CURRENT CONCERNS Current Concerns  Post-Acute Placement   Other Concerns:    SOCIAL WORK ASSESSMENT / PLAN CSW met with patient and patient's spouse at bedside. Patient is very confused and not oriented, pulling at mittens and trying to climb out of the bed. Patient's spouse confirms that patient is frm the memory care unit at [facility] home and will return there upon discharge. He is doing is best to keep patient from climbing out of bed and trying to reorient her.   Assessment/plan status:   Other assessment/ plan:   Information/referral to community resources:    PATIENTS/FAMILYS RESPONSE TO PLAN OF CARE: Spouse is happy with patient's care at [facility] home. he is hopeful that patient will be ready to return there soon as she does better in familiar environments.

## 2014-01-24 NOTE — H&P (Signed)
[facility] History and Physical  Ebony [name] ZOX:[id number] DOB: Dec 12, 1940 DOA: 01/24/2014  Referring physician: ER physician. PCP: PROVIDER NOT IN SYSTEM   Chief Complaint: Altered mental status.  History obtained from patient's husband, previous records, ER physician.  HPI: Ebony [name] is a 73 y.o. female history of dementia, complex partial seizures, hypertension was brought from the nursing home after patient was found to be increasingly lethargic. As per patient's husband patient has been getting increasingly weak over the last few days and yesterday had labs drawn which showed possible UTI and was placed on antibiotics. Today patient in the ER was found to be lethargic initially with hypotension. Patient was given fluid boluses after which patient became more alert. Labs revealed hypernatremia with acute renal failure. Labs from yesterday showed a creatinine of 1.8 today it is around 3. CTA did not show anything acute. Patient has myoclonic jerks. Patient has been having tremors which has per husband has been getting worse and patient's neurologist had decreased her Neurontin from 600 mg 3 times daily to twice a day 6 weeks ago. Patient also was noticed to have nausea vomiting at her nursing facility. On my exam patient presently is oriented to her name and follows commands. Has tremors. Chest x-ray does not show any acute. Patient has been started on empiric antibiotics. As per patient's husband she has been walking until yesterday.  Review of Systems: As presented in the history of presenting illness, rest negative.  Past Medical History  Diagnosis Date  . GERD (gastroesophageal reflux disease)   . Hypertension   . Chest pain, unspecified   . Alzheimer's dementia   . Chronic arthritis   . Postmenopausal   . Seizures   . Depression   . Hyperlipidemia   . Osteoporosis    Past Surgical History  Procedure Laterality Date  . Left hip replacement      following fx    Social History:  reports that she has never smoked. She has never used smokeless tobacco. She reports that she does not drink alcohol or use illicit drugs. Where does patient live  nursing home. Can patient participate in ADLs?  Not sure.  Allergies  Allergen Reactions  . Amitriptyline     Per mar  . Wellbutrin [Bupropion]     Per mar    Family History: History reviewed. No pertinent family history.    Prior to Admission medications   Medication Sig Start Date End Date Taking? Authorizing Provider  acetaminophen (TYLENOL) 500 MG tablet Take 500 mg by mouth 3 (three) times daily.   Yes Historical Provider, MD  Calcium Carb-Cholecalciferol (CALCIUM 600 + D) 600-200 MG-UNIT TABS Take 1 tablet by mouth daily.   Yes Historical Provider, MD  citalopram (CELEXA) 10 MG tablet Take 10 mg by mouth daily.   Yes Historical Provider, MD  donepezil (ARICEPT) 5 MG tablet Take 5-10 mg by mouth 4 (four) times daily. 2 tablets before breakfast, then 1 tablet at noon, 1 tablet at 4pm, and 1 tablet at 8 pm   Yes Historical Provider, MD  fexofenadine (ALLEGRA) 180 MG tablet Take 180 mg by mouth daily.   Yes Historical Provider, MD  fluticasone (FLONASE) 50 MCG/ACT nasal spray Place 1 spray into both nostrils at bedtime.   Yes Historical Provider, MD  gabapentin (NEURONTIN) 600 MG tablet Take 600 mg by mouth 2 (two) times daily.   Yes Historical Provider, MD  irbesartan (AVAPRO) 150 MG tablet Take 150 mg by mouth 2 (two)  times daily.     Yes Historical Provider, MD  lamoTRIgine (LAMICTAL) 25 MG tablet Take 25 mg by mouth 2 (two) times daily.   Yes Historical Provider, MD  memantine (NAMENDA) 10 MG tablet Take 10 mg by mouth 2 (two) times daily. Take two tabs two times a day.    Yes Historical Provider, MD  nebivolol (BYSTOLIC) 5 MG tablet Take 1 tablet (5 mg total) by mouth daily. 06/18/11  Yes Damian LeavellWillie Ransom Stafford Jr., MD  omeprazole (PRILOSEC) 20 MG capsule Take 20 mg by mouth daily.   Yes Historical  Provider, MD  QUEtiapine (SEROQUEL) 50 MG tablet Take 50 mg by mouth at bedtime.   Yes Historical Provider, MD  saccharomyces boulardii (FLORASTOR) 250 MG capsule Take 250 mg by mouth 2 (two) times daily. 7 day therapy course patient began on 01/22/14   Yes Historical Provider, MD  Vitamin D, Ergocalciferol, (DRISDOL) 50000 UNITS CAPS capsule Take 50,000 Units by mouth as directed. Every 2 weeks   Yes Historical Provider, MD    Physical Exam: Filed Vitals:   01/24/14 0157 01/24/14 0224 01/24/14 0315 01/24/14 0332  BP:  98/59  159/62  Pulse: 63   67  Temp:      TempSrc:      Resp: 15  14 20   SpO2: 98%   100%     General:  Well-developed and moderately nourished.  Eyes:  Anicteric no pallor.  ENT:  No discharge from the ears eyes nose mouth.  Neck:  No mass felt.  Cardiovascular:  S1-S2 heard.  Respiratory:  No rhonchi or crepitations.  Abdomen:  Soft nontender bowel sounds present. No guarding rigidity.  Skin:  No rash.  Musculoskeletal:  No edema.  Psychiatric:  Patient is oriented to her name.  Neurologic:  Patient has coarse tremors and moves all extremities. Follows commands.  Labs on Admission:  Basic Metabolic Panel:  Recent Labs Lab 01/24/14 0115  NA 153*  K 4.5  CL 113*  CO2 22  GLUCOSE 106*  BUN 81*  CREATININE 3.30*  CALCIUM 10.3   Liver Function Tests:  Recent Labs Lab 01/24/14 0115  AST 31  ALT 21  ALKPHOS 87  BILITOT 0.5  PROT 7.1  ALBUMIN 3.7   No results found for this basename: LIPASE, AMYLASE,  in the last 168 hours No results found for this basename: AMMONIA,  in the last 168 hours CBC:  Recent Labs Lab 01/24/14 0115  WBC 12.3*  NEUTROABS 8.6*  HGB 14.8  HCT 45.2  MCV 99.6  PLT 180   Cardiac Enzymes:  Recent Labs Lab 01/24/14 0115  TROPONINI <0.30    BNP (last 3 results) No results found for this basename: PROBNP,  in the last 8760 hours CBG: No results found for this basename: GLUCAP,  in the last 168  hours  Radiological Exams on Admission: Dg Chest 1 View  01/24/2014   CLINICAL DATA:  Chest pain  EXAM: CHEST - 1 VIEW  COMPARISON:  Prior radiograph from 10/11/2010  FINDINGS: The cardiac and mediastinal silhouettes are stable in size and contour, and remain within normal limits. Tortuosity of the intrathoracic aorta noted with atherosclerotic calcifications present within the aortic arch.  The lungs are mildly hypoinflated. No airspace consolidation, pleural effusion, or pulmonary edema is identified. There is no pneumothorax.  No acute osseous abnormality identified.  IMPRESSION: No acute cardiopulmonary abnormality identified.   Electronically Signed   By: Rise MuBenjamin  McClintock M.D.   On: 01/24/2014 01:51  Ct Head Wo Contrast  01/24/2014   CLINICAL DATA:  Tremors; dehydration.  EXAM: CT HEAD WITHOUT CONTRAST  TECHNIQUE: Contiguous axial images were obtained from the base of the skull through the vertex without intravenous contrast.  COMPARISON:  CT of the head performed 10/11/2010  FINDINGS: There is no evidence of acute infarction, mass lesion, or intra- or extra-axial hemorrhage on CT.  Prominence of the ventricles and sulci reflects moderate cortical volume loss. Cerebellar atrophy is noted. Diffuse periventricular and subcortical white matter change likely reflects small vessel ischemic microangiopathy. A small chronic lacunar infarct is noted at the left external capsule.  The brainstem and fourth ventricle are within normal limits. The cerebral hemispheres demonstrate grossly normal gray-white differentiation. No mass effect or midline shift is seen.  There is no evidence of fracture; visualized osseous structures are unremarkable in appearance. The orbits are within normal limits. The paranasal sinuses and mastoid air cells are well-aerated. No significant soft tissue abnormalities are seen.  IMPRESSION: 1. No acute intracranial abnormality seen on CT. 2. Moderate cortical volume loss and diffuse  small vessel ischemic microangiopathy. 3. 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   Electronically Signed   By: Roanna Raider M.D.   On: 01/24/2014 02:37    EKG: Independently reviewed.  Baseline is tremulous.  Assessment/Plan Principal Problem:   Acute encephalopathy Active Problems:   ALZHEIMERS DISEASE   HYPERTENSION   AKI (acute kidney injury)   Seizures   1. Acute encephalopathy - most likely a combination of metabolic causes and infectious. Patient has hypernatremia and renal failure probably from dehydration. Patient also has possible UTI for now patient has been placed on vancomycin cefepime. Follow blood cultures and urine cultures and continue with hydration. If mental status does not improve may consider MRI brain and other workup. Hold Neurontin as patient has renal failure with mental status changes. 2. Hypernatremia with renal failure - probably from dehydration. CT abdomen and pelvis is pending to rule out any hydronephrosis or intra-abdominal cause. Patient has received normal sig bolus in the ER after which patient's blood pressure has improved. At this time I have placed patient on D5W. Acute renal failure on chronic kidney disease for which I have morning of patient's blood pressure medication as patient was initially hypotensive and patient also has renal failure for which he is obese have been held. 3. History of seizures - discussed with on-call neurologist since patient is not having reliable oral intake and the speech therapist's consult has been requested until then patient will be placed on IV Vimpat. Change to patient's oral medications Lamictal once patient take orally. Also consider restarting Neurontin oncepatient is more alert awake and renal function improves. 4. Hypertension - since patient is hypotensive and has renal failure antihypertensives presently on hold. Closely follow blood pressure trends. 5. Dementia - continue home medications once  patient is alert awake.    Code Status: Full code. Family Communication:  Patient's husband.  Disposition Plan:  Admit to inpatient.    Makinlee Awwad N. [facility] Pager [phone].  If 7PM-7AM, please contact night-coverage [email or web address] Password [email or web address] 01/24/2014, 4:16 AM

## 2014-01-24 NOTE — Evaluation (Signed)
Clinical/Bedside Swallow Evaluation Patient Details  Name: Ebony [name] MRN: [id number] Date of Birth: 1941-02-18  Today's Date: 01/24/2014 Time: [id number] SLP Time Calculation (min): 27 min  Past Medical History:  Past Medical History  Diagnosis Date  . GERD (gastroesophageal reflux disease)   . Hypertension   . Chest pain, unspecified   . Alzheimer's dementia   . Chronic arthritis   . Postmenopausal   . Seizures   . Depression   . Hyperlipidemia   . Osteoporosis    Past Surgical History:  Past Surgical History  Procedure Laterality Date  . Left hip replacement      following fx   HPI:  73 y.o. female history of dementia, complex partial seizures, hypertension was brought from the nursing home after patient was found to be increasingly lethargic. As per patient's husband report to MD, patient has been getting increasingly weak over the last few days and yesterday had labs drawn which showed possible UTI and was placed on antibiotics. Patient in the ER was found to be lethargic initially with hypotension. Patient became more alert after fluid boluses per MD note.  Labs revealed hypernatremia with acute renal failure.  Patient has myoclonic jerks - tremors which husband reported to MD has been getting worse and patient's neurologist had decreased her Neurontin from 600 mg 3 times daily to twice a day 6 weeks ago. Patient also was noticed to have nausea vomiting at her nursing facility. Pt has tremors and is not consistently following directions. Chest x-ray does not show any acute. Per patient's husband report to MD, she has been walking until yesterday  Bedside swallow evaluation ordered as pt unable to follow directions for stroke swallow screen.  CT head negative for acute event, chronic infarct left external capsule.  Diet at SNF is regular/thin per chart review.     Assessment / Plan / Recommendation Clinical Impression  Suspect transient cognitive based dysphagia due to ?  encephalopathy on top of existing dementia.  Pt accepted only a few bites/sips with slow mastication and oral transited of cracker and applesauce but without oral stasis.  Suspect attention large factor currently as pt benefited from verbal cues to attend to task.  Increased effiency of swallow noted with liquids and clear voice t/o.  Subtle dry cough noted x2 during testing - did not appear to be swallow related.  No focal CN deficits apparent.   Hopeful for improved swallow function as pt's mental status improves.  Recommend dys3/thin with strict precautions due to level or oral deficits and cognition.   SLP to follow up x1 for tolerance and advancement readiness.      Aspiration Risk  Moderate (primarily due to cognition)    Diet Recommendation Dysphagia 3 (Mechanical Soft);Thin liquid   Liquid Administration via: Straw Medication Administration: Crushed with puree Supervision: Full supervision/cueing for compensatory strategies;Trained caregiver to feed patient Compensations: Slow rate;Small sips/bites;Check for pocketing Postural Changes and/or Swallow Maneuvers: Seated upright 90 degrees;Upright 30-60 min after meal    Other  Recommendations Oral Care Recommendations: Oral care before and after PO (due to level of oral difficulties)   Follow Up Recommendations  Skilled Nursing facility    Frequency and Duration min 1 x/week  1 week   Pertinent Vitals/Pain Afebrile, decreased    SLP Swallow Goals     Swallow Study Prior Functional Status   pt resides at memory care unit per discussion with RN=on regular diet prior to admit    General Date of Onset: 01/24/14  HPI: 73 y.o. female history of dementia, complex partial seizures, hypertension was brought from the nursing home after patient was found to be increasingly lethargic. As per patient's husband report to MD, patient has been getting increasingly weak over the last few days and yesterday had labs drawn which showed possible  UTI and was placed on antibiotics. Patient in the ER was found to be lethargic initially with hypotension. Patient became more alert after fluid boluses per MD note.  Labs revealed hypernatremia with acute renal failure.  Patient has myoclonic jerks - tremors which husband reported to MD has been getting worse and patient's neurologist had decreased her Neurontin from 600 mg 3 times daily to twice a day 6 weeks ago. Patient also was noticed to have nausea vomiting at her nursing facility. Pt has tremors and is not consistently following directions. Chest x-ray does not show any acute. Per patient's husband report to MD, she has been walking until yesterday  Bedside swallow evaluation ordered as pt unable to follow directions for stroke swallow screen.  CT head negative for acute event, chronic infarct left external capsule.  Diet at SNF is regular/thin per chart review.   Type of Study: Bedside swallow evaluation Diet Prior to this Study: NPO Temperature Spikes Noted: No Respiratory Status: Nasal cannula History of Recent Intubation: No Behavior/Cognition: Alert;Confused;Agitated;Impulsive;Decreased sustained attention;Distractible Oral Cavity - Dentition: Adequate natural dentition Self-Feeding Abilities: Total assist Patient Positioning: Postural control interferes with function (pt requires cues to attend to task) Baseline Vocal Quality: Clear Volitional Cough: Cognitively unable to elicit Volitional Swallow: Unable to elicit    Oral/Motor/Sensory Function Overall Oral Motor/Sensory Function:  (no focal deficits apparent in this pt with decreased ability to follow commands)   Ice Chips Ice chips: Not tested   Thin Liquid Thin Liquid: Within functional limits Presentation: Straw    Nectar Thick Nectar Thick Liquid: Within functional limits Presentation: Straw;Spoon   Honey Thick Honey Thick Liquid: Not tested   Puree Puree: Impaired Presentation: Spoon Oral Phase Functional Implications:  Prolonged oral transit Pharyngeal Phase Impairments: Suspected delayed Swallow   Solid   GO    Solid: Impaired Oral Phase Impairments: Impaired anterior to posterior transit Oral Phase Functional Implications:  (slow mastication/oral transit) Pharyngeal Phase Impairments: Suspected delayed Fredric Mare, MS [facility] SLP [phone]

## 2014-01-24 NOTE — Progress Notes (Signed)
OT Cancellation Note  Patient Details Name: Arnoldo MoraleJoann S Heatwole MRN: [id number] DOB: 1941/08/12   Cancelled Treatment:    Reason Eval/Treat Not Completed: Other (comment). [facility] and current D/C plan is back to SNF. No apparent immediate acute care OT needs, therefore will defer OT to SNF. If OT eval is needed please call Acute Rehab Dept. at [phone] or text page OT at [phone].    Evette GeorgesLeonard, Mirelle Biskup Eva [phone] 01/24/2014, 3:44 PM

## 2014-01-24 NOTE — ED Notes (Signed)
Patient transported to X-ray 

## 2014-01-24 NOTE — Care Management Note (Addendum)
    Page 1 of 1   01/28/2014     2:35:31 PM   CARE MANAGEMENT NOTE 01/28/2014  Patient:  Ebony [name]   Account Number:  [id number]  Date Initiated:  01/24/2014  Documentation initiated by:  Lanier ClamMAHABIR,Meghna Hagmann  Subjective/Objective Assessment:   73 Y/O F ADMITTED W/AMS.ACUTE ENCEPHALOPATHY.EA:[id number]:DEMENTIA.     Action/Plan:   FROM [facility]MEMORY UNIT.   Anticipated DC Date:  01/28/2014   Anticipated DC Plan:  SKILLED NURSING FACILITY      DC Planning Services  CM consult      Choice offered to / List presented to:             Status of service:  Completed, signed off Medicare Important Message given?   (If response is "NO", the following Medicare IM given date fields will be blank) Date Medicare IM given:   Date Additional Medicare IM given:    Discharge Disposition:  SKILLED NURSING FACILITY  Per UR Regulation:  Reviewed for med. necessity/level of care/duration of stay  If discussed at Long Length of Stay Meetings, dates discussed:    Comments:  01/28/14 [name] RN,BSN NCM [phone] CARDIO HAS SEEN PATIENT.D/C PLAN RETURN TO SNF.  01/27/14 [name] RN,BSN NCM [phone] [place] DURING NIGHT.2 D ECHO.D/C PLAN SNF.  01/24/14 [name] RN,BSN NCM [phone] IVF,IV ABX.D/C PLAN RETURN SNF.

## 2014-01-24 NOTE — ED Notes (Signed)
Bed: WA25 Expected date:  Expected time:  Means of arrival:  Comments: EMS  

## 2014-01-24 NOTE — ED Provider Notes (Signed)
CSN: [id number]     Arrival date & time 01/24/14  0031 History   First MD Initiated Contact with Patient 01/24/14 0045     Chief Complaint  Patient presents with  . Dehydration     (Consider location/radiation/quality/duration/timing/severity/associated sxs/prior Treatment) HPI Comments: LEVEL 5 CAVEAT FOR ALTERED MENTAL STATUS. Pt comes in with cc of dehydration. Hx of functional dementia, seizures, GERD. Depression. Pt is also being treated for UTI right now. She is sent to the ER as she has been altered, not responsive as usual, and having emesis, unable to keep any fluids down. Also, there are increased tremors. Symptoms have been present for the last 2-3 days.  Pt is speaking incoherently to verbal stimuli. She is not making any purposeful movement.  The history is provided by the patient.    Past Medical History  Diagnosis Date  . GERD (gastroesophageal reflux disease)   . Hypertension   . Chest pain, unspecified   . Alzheimer's dementia   . Chronic arthritis   . Postmenopausal   . Seizures   . Depression   . Hyperlipidemia   . Osteoporosis    Past Surgical History  Procedure Laterality Date  . Left hip replacement      following fx   No family history on file. History  Substance Use Topics  . Smoking status: Never Smoker   . Smokeless tobacco: Never Used  . Alcohol Use: No   OB History   Grav Para Term Preterm Abortions TAB SAB Ect Mult Living                 Review of Systems  Unable to perform ROS: Mental status change      Allergies  Amitriptyline and Wellbutrin  Home Medications   Current Outpatient Rx  Name  Route  Sig  Dispense  Refill  . acetaminophen (TYLENOL) 500 MG tablet   Oral   Take 500 mg by mouth 3 (three) times daily.         . Calcium Carb-Cholecalciferol (CALCIUM 600 + D) 600-200 MG-UNIT TABS   Oral   Take 1 tablet by mouth daily.         . citalopram (CELEXA) 10 MG tablet   Oral   Take 10 mg by mouth daily.          Marland Kitchen. donepezil (ARICEPT) 5 MG tablet   Oral   Take 5-10 mg by mouth 4 (four) times daily. 2 tablets before breakfast, then 1 tablet at noon, 1 tablet at 4pm, and 1 tablet at 8 pm         . fexofenadine (ALLEGRA) 180 MG tablet   Oral   Take 180 mg by mouth daily.         . fluticasone (FLONASE) 50 MCG/ACT nasal spray   Each Nare   Place 1 spray into both nostrils at bedtime.         . gabapentin (NEURONTIN) 600 MG tablet   Oral   Take 600 mg by mouth 2 (two) times daily.         . irbesartan (AVAPRO) 150 MG tablet   Oral   Take 150 mg by mouth 2 (two) times daily.           Marland Kitchen. lamoTRIgine (LAMICTAL) 25 MG tablet   Oral   Take 25 mg by mouth 2 (two) times daily.         . memantine (NAMENDA) 10 MG tablet   Oral   Take 10  mg by mouth 2 (two) times daily. Take two tabs two times a day.          . nebivolol (BYSTOLIC) 5 MG tablet   Oral   Take 1 tablet (5 mg total) by mouth daily.   30 tablet   11   . omeprazole (PRILOSEC) 20 MG capsule   Oral   Take 20 mg by mouth daily.         . QUEtiapine (SEROQUEL) 50 MG tablet   Oral   Take 50 mg by mouth at bedtime.         Marland Kitchen saccharomyces boulardii (FLORASTOR) 250 MG capsule   Oral   Take 250 mg by mouth 2 (two) times daily. 7 day therapy course patient began on 01/22/14         . Vitamin D, Ergocalciferol, (DRISDOL) 50000 UNITS CAPS capsule   Oral   Take 50,000 Units by mouth as directed. Every 2 weeks          BP 98/59  Pulse 63  Temp(Src) 99.8 F (37.7 C) (Rectal)  Resp 15  SpO2 98% Physical Exam  Nursing note and vitals reviewed. Constitutional: She appears well-developed.  HENT:  Head: Atraumatic.  Dry mucus membrane  Eyes: Conjunctivae are normal.  Neck: Neck supple.  Cardiovascular: Normal rate.   Pulmonary/Chest: Effort normal.  Abdominal: Soft. There is tenderness. There is no rebound and no guarding.  Grimacing with palpation of the suprapubic region.    ED Course  Procedures  (including critical care time) Labs Review Labs Reviewed  CBC WITH DIFFERENTIAL - Abnormal; Notable for the following:    WBC 12.3 (*)    Neutro Abs 8.6 (*)    Monocytes Absolute 1.3 (*)    All other components within normal limits  COMPREHENSIVE METABOLIC PANEL - Abnormal; Notable for the following:    Sodium 153 (*)    Chloride 113 (*)    Glucose, Bld 106 (*)    BUN 81 (*)    Creatinine, Ser 3.30 (*)    GFR calc non Af Amer 13 (*)    GFR calc Af Amer 15 (*)    All other components within normal limits  URINALYSIS, ROUTINE W REFLEX MICROSCOPIC - Abnormal; Notable for the following:    Color, Urine AMBER (*)    Bilirubin Urine MODERATE (*)    Protein, ur 30 (*)    Leukocytes, UA SMALL (*)    All other components within normal limits  BLOOD GAS, VENOUS - Abnormal; Notable for the following:    pH, Ven 7.364 (*)    pO2, Ven 0.0 (*)    Bicarbonate 26.7 (*)    All other components within normal limits  CULTURE, BLOOD (ROUTINE X 2)  CULTURE, BLOOD (ROUTINE X 2)  URINE CULTURE  TROPONIN I  URINE MICROSCOPIC-ADD ON  I-STAT VENOUS BLOOD GAS, ED  I-STAT CG4 LACTIC ACID, ED   Imaging Review Dg Chest 1 View  01/24/2014   CLINICAL DATA:  Chest pain  EXAM: CHEST - 1 VIEW  COMPARISON:  Prior radiograph from 10/11/2010  FINDINGS: The cardiac and mediastinal silhouettes are stable in size and contour, and remain within normal limits. Tortuosity of the intrathoracic aorta noted with atherosclerotic calcifications present within the aortic arch.  The lungs are mildly hypoinflated. No airspace consolidation, pleural effusion, or pulmonary edema is identified. There is no pneumothorax.  No acute osseous abnormality identified.  IMPRESSION: No acute cardiopulmonary abnormality identified.   Electronically Signed   By:  Rise Mu M.D.   On: 01/24/2014 01:51     EKG Interpretation   Date/Time:  Monday January 24 2014 01:14:46 EDT Ventricular Rate:  60 PR Interval:  189 QRS Duration:  83 QT Interval:  581 QTC Calculation: 581 R Axis:   -6 Text Interpretation:  Sinus rhythm Nonspecific T abnrm, anterolateral  leads Prolonged QT interval Confirmed by Rhunette Croft, MD, Janey Genta [phone]) on  01/24/2014 2:39:40 AM Also confirmed by Rhunette Croft, MD, Janey Genta ([id number])  on  01/24/2014 2:39:51 AM      MDM   Final diagnoses:  None   Pt comes in with cc of AMS.  DDx: Sepsis syndrome ACS syndrome DKA ICH Stroke COPD exacerbation Infection - pneumonia/UTI/Cellulitis Dehydration Electrolyte abnormality Tox syndrome  Pt comes in with cc of AMS. Seems like she is a conversing demented patient at baseline, who has declined over the last 2 days. Increased tremors, decreased mentation. She is being treated for UTI currently, appears dry, and is having nausea and emesis.  Concerns for sepsis from pyelonephritis, pt is hypotensive at arrival. If lactate is elevated, there is also hypovolemia component. Will get CT head as well for her AMS, nausea  - however, this is likely related to infection, e'lyte abn.    Derwood Kaplan, MD 01/24/14 [phone]

## 2014-01-25 DIAGNOSIS — E876 Hypokalemia: Secondary | ICD-10-CM

## 2014-01-25 LAB — BASIC METABOLIC PANEL
BUN: 23 mg/dL (ref 6–23)
CHLORIDE: 102 meq/L (ref 96–112)
CO2: 23 mEq/L (ref 19–32)
Calcium: 8.3 mg/dL — ABNORMAL LOW (ref 8.4–10.5)
Creatinine, Ser: 0.79 mg/dL (ref 0.50–1.10)
GFR, EST NON AFRICAN AMERICAN: 80 mL/min — AB (ref >90)
GLUCOSE: 99 mg/dL (ref 70–99)
POTASSIUM: 3.1 meq/L — AB (ref 3.7–5.3)
SODIUM: 138 meq/L (ref 137–147)

## 2014-01-25 LAB — URINE CULTURE
CULTURE: NO GROWTH
Colony Count: NO GROWTH

## 2014-01-25 LAB — OCCULT BLOOD X 1 CARD TO LAB, STOOL: Fecal Occult Bld: POSITIVE — AB

## 2014-01-25 MED ORDER — SACCHAROMYCES BOULARDII 250 MG PO CAPS
250.0000 mg | ORAL_CAPSULE | Freq: Two times a day (BID) | ORAL | Status: DC
  Administered 2014-01-25 – 2014-01-28 (×7): 250 mg via ORAL
  Filled 2014-01-25 (×8): qty 1

## 2014-01-25 MED ORDER — MEMANTINE HCL 10 MG PO TABS
10.0000 mg | ORAL_TABLET | Freq: Two times a day (BID) | ORAL | Status: DC
  Administered 2014-01-25 – 2014-01-28 (×7): 10 mg via ORAL
  Filled 2014-01-25 (×9): qty 1

## 2014-01-25 MED ORDER — POTASSIUM CHLORIDE CRYS ER 20 MEQ PO TBCR
40.0000 meq | EXTENDED_RELEASE_TABLET | Freq: Once | ORAL | Status: AC
Start: 2014-01-25 — End: 2014-01-25
  Administered 2014-01-25: 40 meq via ORAL
  Filled 2014-01-25: qty 2

## 2014-01-25 MED ORDER — VANCOMYCIN HCL IN DEXTROSE 1-5 GM/200ML-% IV SOLN
1000.0000 mg | Freq: Once | INTRAVENOUS | Status: DC
  Filled 2014-01-25: qty 200

## 2014-01-25 MED ORDER — VANCOMYCIN HCL 500 MG IV SOLR: 500.0000 mg | Freq: Two times a day (BID) | INTRAVENOUS | Status: DC

## 2014-01-25 MED ORDER — CITALOPRAM HYDROBROMIDE 10 MG PO TABS
10.0000 mg | ORAL_TABLET | Freq: Every day | ORAL | Status: DC
  Administered 2014-01-25 – 2014-01-28 (×4): 10 mg via ORAL
  Filled 2014-01-25 (×4): qty 1

## 2014-01-25 MED ORDER — DONEPEZIL HCL 5 MG PO TABS
5.0000 mg | ORAL_TABLET | ORAL | Status: DC
  Administered 2014-01-25 – 2014-01-28 (×10): 5 mg via ORAL
  Filled 2014-01-25 (×12): qty 1

## 2014-01-25 MED ORDER — NEBIVOLOL HCL 5 MG PO TABS
5.0000 mg | ORAL_TABLET | Freq: Every day | ORAL | Status: DC
  Administered 2014-01-25 – 2014-01-27 (×3): 5 mg via ORAL
  Filled 2014-01-25 (×3): qty 1

## 2014-01-25 MED ORDER — PANTOPRAZOLE SODIUM 40 MG PO TBEC
40.0000 mg | DELAYED_RELEASE_TABLET | Freq: Every day | ORAL | Status: DC
  Administered 2014-01-25 – 2014-01-28 (×4): 40 mg via ORAL
  Filled 2014-01-25 (×4): qty 1

## 2014-01-25 MED ORDER — LAMOTRIGINE 25 MG PO TABS
25.0000 mg | ORAL_TABLET | Freq: Two times a day (BID) | ORAL | Status: DC
  Administered 2014-01-25 – 2014-01-28 (×7): 25 mg via ORAL
  Filled 2014-01-25 (×9): qty 1

## 2014-01-25 MED ORDER — DONEPEZIL HCL 10 MG PO TABS
10.0000 mg | ORAL_TABLET | Freq: Every day | ORAL | Status: DC
  Administered 2014-01-26 – 2014-01-28 (×3): 10 mg via ORAL
  Filled 2014-01-25 (×6): qty 1

## 2014-01-25 NOTE — Progress Notes (Addendum)
Speech Language Pathology Treatment: Dysphagia  Patient Details Name: Ebony [name] MRN: [id number] DOB: [birth year] Today's Date: 01/25/2014 Time: 5597-4163 SLP Time Calculation (min): 25 min  Assessment / Plan / Recommendation Clinical Impression  Pt with improved participation during treatment today, provided pt with graham cracker, ice cream and water. Slow mastication that was effective *pt did not open mouth per command to assess for oral clearance. Dry cough noted before and during intake - do not suspect aspiration as source.   Educated spouse to aspiration precautions, last remaining gustatory sense intact and to compensation strategies for increased safety with po.  Spouse present and reports pt to be nearing baseline cognitive-linguistic and swallow function.  Recommend continue mechanical soft diet given pt's cognitive and medical status, likely may benefit from modified diet long term.    Pt recognized spouse but did not recall that tomorrow was their anniversary after 30 seconds of being informed.  Much of her verbal output was incomprehensible consistent with "word salad".  Pt did verbalize appropriate social responses- saying "thank you".  As pt will have needed level of assistance upon dc and CT head negative, SLP deferred speech evaluation-spouse agreeable.       HPI HPI: 73 y.o. female history of dementia, complex partial seizures, hypertension was brought from the nursing home after patient was found to be increasingly lethargic. As per patient's husband report to MD, patient has been getting increasingly weak over the last few days and yesterday had labs drawn which showed possible UTI and was placed on antibiotics. Patient in the ER was found to be lethargic initially with hypotension. Patient became more alert after fluid boluses per MD note.  Labs revealed hypernatremia with acute renal failure.  Patient has myoclonic jerks - tremors which husband reported to MD has been getting  worse and patient's neurologist had decreased her Neurontin from 600 mg 3 times daily to twice a day 6 weeks ago. Patient also was noticed to have nausea vomiting at her nursing facility. Pt has tremors and is not consistently following directions. Chest x-ray does not show any acute. Per patient's husband report to MD, she has been walking until yesterday  Bedside swallow evaluation ordered as pt unable to follow directions for stroke swallow screen.  CT head negative for acute event, chronic infarct left external capsule.  Diet at SNF is regular/thin per chart review.     Pertinent Vitals Afebrile, decreased  SLP Plan  All goals met    Recommendations Diet recommendations: Dysphagia 3 (mechanical soft);Thin liquid Medication Administration: Crushed with puree Supervision: Full supervision/cueing for compensatory strategies;Trained caregiver to feed patient Compensations: Slow rate;Small sips/bites;Check for pocketing Postural Changes and/or Swallow Maneuvers: Seated upright 90 degrees;Upright 30-60 min after meal              Oral Care Recommendations: Oral care before and after PO Follow up Recommendations: 24 hour supervision/assistance Plan: All goals met    Perley, Nelson, [place] [facility] [place] [phone]

## 2014-01-25 NOTE — Progress Notes (Signed)
INITIAL NUTRITION ASSESSMENT  DOCUMENTATION CODES Per approved criteria  -Not Applicable   INTERVENTION: -Diet textures per SLP recommendations -Supplementation as warranted -Will continue to monitor  NUTRITION DIAGNOSIS: Swallowing difficulty related to motor causes as evidenced by AMS, hx of Alzheimer's, need for diet texture modification.   Goal: Pt to meet >/= 90% of their estimated nutrition needs    Monitor:  Total protein/energy intake, labs, weights, swallow profile  Reason for Assessment: Low Braden Score  73 y.o. female  Admitting Dx: Acute encephalopathy  ASSESSMENT: Ebony [name] is a 73 y.o. female history of dementia, complex partial seizures, hypertension was brought from the nursing home after patient was found to be increasingly lethargic. As per patient's husband patient has been getting increasingly weak over the last few days and yesterday had labs drawn which showed possible UTI and was placed on antibiotics. Today patient in the ER was found to be lethargic initially with hypotension.  -Pt was evaluated by SLP on 4/07 d/t AMS/dementia -SLP recommended pt be placed on Dys3/thin d/t decreased mental status, not necessarily delayed or compromised swallowing function. Plan to follow up once pt becomes more alert -Pt was on regular diet textures at SNF, Husband denied any changes in weight or appetite per initial RN nutrition screen -Had some episodes of nausea/vomiting pta. Admitted with dehydration and AKI per MD, resolved with IVF fluid rehydration -Pt has rectal tube in place with loose stools. Positive for C.diff. -Is receiving feeding assistance. RN documented pt consumed 40% of breakfast. Viewed pt's lunch tray with Nurse Tech, pt consumed approximately 75% of meal.  -Will consider addition of supplement if pt's PO declines to <50%  Height: Ht Readings from Last 1 Encounters:  01/24/14 5' (1.524 m)    Weight: Wt Readings from Last 1 Encounters:   01/24/14 125 lb (56.7 kg)    Ideal Body Weight: 100 lbs  % Ideal Body Weight: 125%  Wt Readings from Last 10 Encounters:  01/24/14 125 lb (56.7 kg)  04/17/11 138 lb (62.596 kg)  09/27/10 137 lb (62.143 kg)  06/28/09 132 lb (59.875 kg)  09/07/08 126 lb (57.153 kg)  06/23/08 127 lb (57.607 kg)  12/24/07 129 lb (58.514 kg)  06/09/07 142 lb (64.411 kg)    Usual Body Weight: unable to determine  % Usual Body Weight: unable to determine  BMI:  Body mass index is 24.41 kg/(m^2).  Estimated Nutritional Needs: Kcal: 1400-1600 Protein: 55-65 gram Fluid: >/=1700 ml/daily  Skin: WDL  Diet Order: Dysphagia 3/thin  EDUCATION NEEDS: -No education needs identified at this time   Intake/Output Summary (Last 24 hours) at 01/25/14 1354 Last data filed at 01/25/14 1200  Gross per 24 hour  Intake 2441.67 ml  Output      0 ml  Net 2441.67 ml    Last BM: 4/07   Labs:   Recent Labs Lab 01/24/14 0115 01/24/14 0630 01/25/14 0815  NA 153* 154* 138  K 4.5 4.1 3.1*  CL 113* 120* 102  CO2 22 21 23   BUN 81* 64* 23  CREATININE 3.30* 2.21* 0.79  CALCIUM 10.3 8.3* 8.3*  GLUCOSE 106* 94 99    CBG (last 3)  No results found for this basename: GLUCAP,  in the last 72 hours  Scheduled Meds: . citalopram  10 mg Oral Daily  . donepezil  5 mg Oral 3 times per day   And  . [START ON 01/26/2014] donepezil  10 mg Oral QAC breakfast  . enoxaparin (LOVENOX) injection  30 mg Subcutaneous Q24H  . fluticasone  1 spray Each Nare QHS  . lamoTRIgine  25 mg Oral BID  . memantine  10 mg Oral BID  . metroNIDAZOLE  500 mg Oral 3 times per day  . nebivolol  5 mg Oral Daily  . pantoprazole  40 mg Oral Daily  . saccharomyces boulardii  250 mg Oral BID    Continuous Infusions:   Past Medical History  Diagnosis Date  . GERD (gastroesophageal reflux disease)   . Hypertension   . Chest pain, unspecified   . Alzheimer's dementia   . Chronic arthritis   . Postmenopausal   . Seizures   .  Depression   . Hyperlipidemia   . Osteoporosis     Past Surgical History  Procedure Laterality Date  . Left hip replacement      following fx    Lloyd HugerSarah F Sanchez Hemmer MS RD LDN Clinical Dietitian Pager:[phone]

## 2014-01-25 NOTE — Plan of Care (Signed)
Problem: Consults Goal: General Medical Patient Education See Patient Education Module for specific education.  Outcome: Not Met (add Reason) Pt confused not able to understand

## 2014-01-25 NOTE — Evaluation (Signed)
Physical Therapy Evaluation Patient Details Name: Ebony MoraleJoann S Mineer MRN: [id number] DOB: 08/02/1941 Today's Date: 01/25/2014   History of Present Illness  [age] y female admitted with acute encephalopathy. Hx of dementia. seizures, HTN. [facility]   Clinical Impression  On eval, pt required Mod assist for mobility-able to ambulate ~15 feet in room with 1 handheld assist. Pt does not follow verbal commands. No family present during eval. Recommend return to SNF.     Follow Up Recommendations No PT follow up;Supervision/Assistance - 24 hour (return to SNF)    Equipment Recommendations  None recommended by PT    Recommendations for Other Services       Precautions / Restrictions Precautions Precautions: Fall Restrictions Weight Bearing Restrictions: No      Mobility  Bed Mobility Overal bed mobility: Needs Assistance Bed Mobility: Supine to Sit;Sit to Supine     Supine to sit: Mod assist Sit to supine: Mod assist   General bed mobility comments: Assist for trunk and bil LEs. Pt does not initiate.   Transfers Overall transfer level: Needs assistance   Transfers: Sit to/from Stand Sit to Stand: Min assist         General transfer comment: Assist to rise, stabilize, control descent.   Ambulation/Gait Ambulation/Gait assistance: Min assist Ambulation Distance (Feet): 15 Feet Assistive device: 1 person hand held assist Gait Pattern/deviations: Decreased stride length     General Gait Details: assist to stabilize. unsteady. ambulation in room around bed. Sitter present and recommended against ambulation in hallway due to incontinence, C-diff.   Stairs            Wheelchair Mobility    Modified Rankin (Stroke Patients Only)       Balance Overall balance assessment: Needs assistance         Standing balance support: Single extremity supported;During functional activity Standing balance-Leahy Scale: Poor                                Pertinent Vitals/Pain No c/o pain    Home Living Family/patient expects to be discharged to:: Skilled nursing facility                      Prior Function Level of Independence: Needs assistance   Gait / Transfers Assistance Needed: independent with ambulation at facility     Comments: unsure of PLOF outside of ambulation     Hand Dominance        Extremity/Trunk Assessment   Upper Extremity Assessment: Difficult to assess due to impaired cognition           Lower Extremity Assessment: Difficult to assess due to impaired cognition      Cervical / Trunk Assessment: Normal  Communication      Cognition Arousal/Alertness: Awake/alert Behavior During Therapy: Restless Overall Cognitive Status: Difficult to assess (history of dementia) Area of Impairment: Orientation;Attention;Following commands;Safety/judgement Orientation Level: Disoriented to;Situation;Time;Place     Following Commands: Follows one step commands inconsistently Safety/Judgement: Decreased awareness of safety;Decreased awareness of deficits          General Comments      Exercises        Assessment/Plan    PT Assessment Patient needs continued PT services  PT Diagnosis Difficulty walking;Generalized weakness;Altered mental status   PT Problem List Decreased balance;Decreased mobility;Decreased cognition;Decreased safety awareness  PT Treatment Interventions DME instruction;Gait training;Functional mobility training;Therapeutic activities;Therapeutic exercise;Patient/family  education;Balance training   PT Goals (Current goals can be found in the Care Plan section) Acute Rehab PT Goals Patient Stated Goal: none stated PT Goal Formulation: Patient unable to participate in goal setting Time For Goal Achievement: 02/08/14 Potential to Achieve Goals: Fair    Frequency Min 3X/week   Barriers to discharge        Co-evaluation               End of  Session Equipment Utilized During Treatment: Gait belt Activity Tolerance: Patient tolerated treatment well Patient left: in bed;with call bell/phone within reach;with nursing/sitter in room           Time: 1015-1025 PT Time Calculation (min): 10 min   Charges:   PT Evaluation $Initial PT Evaluation Tier I: 1 Procedure PT Treatments $Gait Training: 8-22 mins   PT G Codes:          Rebeca Alert, MPT Pager: [phone]

## 2014-01-25 NOTE — Progress Notes (Signed)
[facility] PROGRESS NOTE  Ebony [name] ZOX:[id number] DOB: 1941-06-03 DOA: 01/24/2014 PCP: PROVIDER NOT IN SYSTEM  Assessment/Plan: Principal Problem:   Acute encephalopathy - Improving with IV fluids and current antibiotic regimen - Patient tested positive for C. difficile and a such placed on Flagyl - Initially thought to be secondary to UTI although on review of urinalysis not overly convincing for source. Did report small amount of leukocytes. Urine culture negative. We'll discontinue broad-spectrum antibiotics and continue patient on oral Flagyl - CT of head reported no acute intracranial abnormality - CT of abdomen and pelvis reported no acute abnormality seen to explain the patient's symptoms. Did however report mild wall thickening of the rectum - Although patient tested positive for C. difficile I am not convinced that patient has active infection causing AMS. Patient may just be colonized. At this juncture given workup patient may have been altered mentally secondary to severe dehydration with worsening AKI  Active Problems:   ALZHEIMERS DISEASE - Now that patient is taking oral intake and has improvement patient will continue home regimen    HYPERTENSION - ARB held secondary to AKI - Will continue beta blocker, blood pressure relatively well controlled on this regimen    AKI (acute kidney injury) - Resolved with IV fluid rehydration and discontinuation of ARB    Seizures - Not a patient is taking by mouth we'll continue home regimen    Hypokalemia - Replace orally and reassess next a.m.  Code Status: full Family Communication: Discussed with spouse Disposition Plan: Pending continued improvement back to baseline. With continued improvement likely discharged back to facility within the next 2-3 days   Consultants:  None  Procedures:  None  Antibiotics:  Vancomycin and cefepime>>>01/25/14  Metronidazole  HPI/Subjective: Husband reports patient's  mentation improving and much improved. No new problems reported overnight  Objective: Filed Vitals:   01/25/14 0520  BP: 120/74  Pulse: 93  Temp:   Resp: 20    Intake/Output Summary (Last 24 hours) at 01/25/14 1331 Last data filed at 01/25/14 1200  Gross per 24 hour  Intake 2441.67 ml  Output      0 ml  Net 2441.67 ml   Filed Weights   01/24/14 0608  Weight: 56.7 kg (125 lb)    Exam:   General:  Patient in no acute distress, alert and awake  Cardiovascular: Normal S1 and S2, no rubs  Respiratory: Clear to auscultation bilaterally, no wheeze  Abdomen: Obese, nontender nondistended  Musculoskeletal: No cyanosis or clubbing  Data Reviewed: Basic Metabolic Panel:  Recent Labs Lab 01/24/14 0115 01/24/14 0630 01/25/14 0815  NA 153* 154* 138  K 4.5 4.1 3.1*  CL 113* 120* 102  CO2 22 21 23   GLUCOSE 106* 94 99  BUN 81* 64* 23  CREATININE 3.30* 2.21* 0.79  CALCIUM 10.3 8.3* 8.3*   Liver Function Tests:  Recent Labs Lab 01/24/14 0115 01/24/14 0630  AST 31 30  ALT 21 18  ALKPHOS 87 71  BILITOT 0.5 0.4  PROT 7.1 5.9*  ALBUMIN 3.7 3.0*   No results found for this basename: LIPASE, AMYLASE,  in the last 168 hours No results found for this basename: AMMONIA,  in the last 168 hours CBC:  Recent Labs Lab 01/24/14 0115 01/24/14 0630  WBC 12.3* 10.2  NEUTROABS 8.6* 7.1  HGB 14.8 12.6  HCT 45.2 39.2  MCV 99.6 100.0  PLT 180 155   Cardiac Enzymes:  Recent Labs Lab 01/24/14 0115 01/24/14 0630  TROPONINI <0.30 <  0.30   BNP (last 3 results) No results found for this basename: PROBNP,  in the last 8760 hours CBG: No results found for this basename: GLUCAP,  in the last 168 hours  Recent Results (from the past 240 hour(s))  CULTURE, BLOOD (ROUTINE X 2)     Status: None   Collection Time    01/24/14  1:15 AM      Result Value Ref Range Status   Specimen Description BLOOD LEFT ARM   Final   Special Requests BOTTLES DRAWN AEROBIC AND ANAEROBIC 2CC    Final   Culture  Setup Time     Final   Value: 01/24/2014 08:40     Performed at [facility]   Culture     Final   Value:        BLOOD CULTURE RECEIVED NO GROWTH TO DATE CULTURE WILL BE HELD FOR 5 DAYS BEFORE ISSUING A FINAL NEGATIVE REPORT     Performed at [facility]   Report Status PENDING   Incomplete  URINE CULTURE     Status: None   Collection Time    01/24/14  1:17 AM      Result Value Ref Range Status   Specimen Description URINE, CATHETERIZED   Final   Special Requests NONE   Final   Culture  Setup Time     Final   Value: 01/24/2014 09:16     Performed at [facility] Count     Final   Value: NO GROWTH     Performed at [facility]   Culture     Final   Value: NO GROWTH     Performed at [facility]   Report Status 01/25/2014 FINAL   Final  CULTURE, BLOOD (ROUTINE X 2)     Status: None   Collection Time    01/24/14  1:25 AM      Result Value Ref Range Status   Specimen Description BLOOD RIGHT ANTECUBITAL   Final   Special Requests BOTTLES DRAWN AEROBIC AND ANAEROBIC 10CC   Final   Culture  Setup Time     Final   Value: 01/24/2014 08:40     Performed at [facility]   Culture     Final   Value:        BLOOD CULTURE RECEIVED NO GROWTH TO DATE CULTURE WILL BE HELD FOR 5 DAYS BEFORE ISSUING A FINAL NEGATIVE REPORT     Performed at [facility]   Report Status PENDING   Incomplete  MRSA PCR SCREENING     Status: None   Collection Time    01/24/14  6:50 AM      Result Value Ref Range Status   MRSA by PCR NEGATIVE  NEGATIVE Final   Comment:            The GeneXpert MRSA Assay (FDA     approved for NASAL specimens     only), is one component of a     comprehensive MRSA colonization     surveillance program. It is not     intended to diagnose MRSA     infection nor to guide or     monitor treatment for     MRSA infections.  CLOSTRIDIUM DIFFICILE BY PCR     Status: Abnormal   Collection Time     01/24/14 10:27 AM      Result Value Ref Range Status   C difficile  by pcr POSITIVE (*) NEGATIVE Final   Comment: CRITICAL RESULT CALLED TO, READ BACK BY AND VERIFIED WITH:     JESSICA DEUTSCH,RN AT 1351 01/24/14 BY K BARR     [facility]     Studies: Ct Abdomen Pelvis Wo Contrast  01/24/2014   CLINICAL DATA:  Leukocytosis. White blood cells in the urine. Assess for hydronephrosis.  EXAM: CT ABDOMEN AND PELVIS WITHOUT CONTRAST  TECHNIQUE: Multidetector CT imaging of the abdomen and pelvis was performed following the standard protocol without intravenous contrast.  COMPARISON:  Pelvic ultrasound performed 10/11/2010, and CT of the abdomen and pelvis performed 02/17/2010  FINDINGS: Minimal left basilar atelectasis is noted. Scattered coronary artery calcifications are seen.  The liver and spleen are unremarkable in appearance. The gallbladder is within normal limits. The pancreas and adrenal glands are unremarkable.  A 2.4 cm cyst is noted at the anterior aspect of the left kidney. The kidneys are otherwise unremarkable in appearance. There is no evidence of hydronephrosis. No renal or ureteral stones are seen. No perinephric stranding is appreciated.  No free fluid is identified. The small bowel is unremarkable in appearance. The stomach is within normal limits. No acute vascular abnormalities are seen. Mild scattered calcification is seen along the abdominal aorta.  The appendix is not definitely seen; there is no evidence of appendicitis. The colon is unremarkable in appearance. Mild distention of the rectum with dense stool is noted, measuring 7.2 cm in transverse dimension, with associated mild wall thickening, raising question for mild proctitis.  The bladder is mildly distended and grossly unremarkable. The uterus demonstrates scattered calcification, nonspecific in appearance. The ovaries are mildly asymmetric, with a likely physiologic 2.4 cm left adnexal cystic lesion seen; this is  stable from 2011. No definite suspicious adnexal masses are identified. No inguinal lymphadenopathy is seen.  No acute osseous abnormalities are identified. There is mild chronic grade 1 anterolisthesis of L4 on L5, reflecting underlying facet disease. 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  IMPRESSION: 1. No acute abnormality seen to explain the patient's symptoms. 2. Distention of the rectum with dense stool to 7.2 cm, with associated mild wall thickening, raising question for mild proctitis. 3. Scattered coronary artery calcifications seen. 4. Left renal cyst seen. 5. Mild scattered calcification along the abdominal aorta.   Electronically Signed   By: Roanna RaiderJeffery  Chang M.D.   On: 01/24/2014 05:40   Dg Chest 1 View  01/24/2014   CLINICAL DATA:  Chest pain  EXAM: CHEST - 1 VIEW  COMPARISON:  Prior radiograph from 10/11/2010  FINDINGS: The cardiac and mediastinal silhouettes are stable in size and contour, and remain within normal limits. Tortuosity of the intrathoracic aorta noted with atherosclerotic calcifications present within the aortic arch.  The lungs are mildly hypoinflated. No airspace consolidation, pleural effusion, or pulmonary edema is identified. There is no pneumothorax.  No acute osseous abnormality identified.  IMPRESSION: No acute cardiopulmonary abnormality identified.   Electronically Signed   By: Rise MuBenjamin  McClintock M.D.   On: 01/24/2014 01:51   Ct Head Wo Contrast  01/24/2014   CLINICAL DATA:  Tremors; dehydration.  EXAM: CT HEAD WITHOUT CONTRAST  TECHNIQUE: Contiguous axial images were obtained from the base of the skull through the vertex without intravenous contrast.  COMPARISON:  CT of the head performed 10/11/2010  FINDINGS: There is no evidence of acute infarction, mass lesion, or intra- or extra-axial hemorrhage on CT.  Prominence of the ventricles and sulci reflects moderate cortical  volume loss. Cerebellar atrophy is noted. Diffuse periventricular and subcortical  white matter change likely reflects small vessel ischemic microangiopathy. A small chronic lacunar infarct is noted at the left external capsule.  The brainstem and fourth ventricle are within normal limits. The cerebral hemispheres demonstrate grossly normal gray-white differentiation. No mass effect or midline shift is seen.  There is no evidence of fracture; visualized osseous structures are unremarkable in appearance. The orbits are within normal limits. The paranasal sinuses and mastoid air cells are well-aerated. No significant soft tissue abnormalities are seen.  IMPRESSION: 1. No acute intracranial abnormality seen on CT. 2. Moderate cortical volume loss and diffuse small vessel ischemic microangiopathy. 3. 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   Electronically Signed   By: Roanna Raider M.D.   On: 01/24/2014 02:37    Scheduled Meds: . ceFEPime (MAXIPIME) IV  1 g Intravenous Q24H  . citalopram  10 mg Oral Daily  . donepezil  5 mg Oral 3 times per day   And  . [START ON 01/26/2014] donepezil  10 mg Oral QAC breakfast  . enoxaparin (LOVENOX) injection  30 mg Subcutaneous Q24H  . fluticasone  1 spray Each Nare QHS  . lamoTRIgine  25 mg Oral BID  . memantine  10 mg Oral BID  . metroNIDAZOLE  500 mg Oral 3 times per day  . nebivolol  5 mg Oral Daily  . pantoprazole  40 mg Oral Daily  . potassium chloride  40 mEq Oral Once  . saccharomyces boulardii  250 mg Oral BID  . [START ON 01/26/2014] vancomycin  750 mg Intravenous Q48H   Continuous Infusions:    Time spent: > 35 minutes    Penny Pia  [facility] Pager [phone]. If 7PM-7AM, please contact night-coverage at [email or web address], password [facility] 01/25/2014, 1:31 PM  LOS: 1 day

## 2014-01-26 LAB — BASIC METABOLIC PANEL
BUN: 13 mg/dL (ref 6–23)
CHLORIDE: 102 meq/L (ref 96–112)
CO2: 21 mEq/L (ref 19–32)
Calcium: 8.4 mg/dL (ref 8.4–10.5)
Creatinine, Ser: 0.69 mg/dL (ref 0.50–1.10)
GFR, EST NON AFRICAN AMERICAN: 84 mL/min — AB (ref >90)
Glucose, Bld: 91 mg/dL (ref 70–99)
Potassium: 3.7 mEq/L (ref 3.7–5.3)
SODIUM: 137 meq/L (ref 137–147)

## 2014-01-26 LAB — CBC
HEMATOCRIT: 36.4 % (ref 36.0–46.0)
HEMOGLOBIN: 12.6 g/dL (ref 12.0–15.0)
MCH: 32.1 pg (ref 26.0–34.0)
MCHC: 34.6 g/dL (ref 30.0–36.0)
MCV: 92.9 fL (ref 78.0–100.0)
Platelets: 154 10*3/uL (ref 150–400)
RBC: 3.92 MIL/uL (ref 3.87–5.11)
RDW: 12.4 % (ref 11.5–15.5)
WBC: 7.2 10*3/uL (ref 4.0–10.5)

## 2014-01-26 NOTE — Progress Notes (Signed)
Physical Therapy Treatment Patient Details Name: Ebony MoraleJoann S [name] MRN: [id number] DOB: 31-Mar-1941 Today's Date: 01/26/2014    History of Present Illness [age] y female admitted with acute encephalopathy. Hx of dementia. seizures, HTN. [facility].     PT Comments    Able to walk farther today however pt required increased assistance. Possibly due to pt's resistance to mobilize on today. Recommend return to SNF  Follow Up Recommendations  Supervision/Assistance - 24 hour;SNF     Equipment Recommendations  None recommended by PT    Recommendations for Other Services       Precautions / Restrictions Precautions Precautions: Fall Restrictions Weight Bearing Restrictions: No    Mobility  Bed Mobility Overal bed mobility: Needs Assistance Bed Mobility: Supine to Sit;Sit to Supine     Supine to sit: Mod assist;+2 for physical assistance;+2 for safety/equipment Sit to supine: Mod assist   General bed mobility comments: Pt resistant to getting to EOB so required increased assistance for trunk and LEs.   Transfers Overall transfer level: Needs assistance   Transfers: Sit to/from Stand Sit to Stand: Mod assist;+2 physical assistance;+2 safety/equipment         General transfer comment: Assist to rise, stabilize, control descent.   Ambulation/Gait Ambulation/Gait assistance: Mod assist;+2 physical assistance;+2 safety/equipment Ambulation Distance (Feet): 85 Feet Assistive device: 2 person hand held assist Gait Pattern/deviations: Decreased stride length;Narrow base of support;Scissoring;Decreased step length - left;Decreased step length - right     General Gait Details: assist to stabilize/support pt. Very unsteady. Intermittent scissoring noted. At risk for falls.    Stairs            Wheelchair Mobility    Modified Rankin (Stroke Patients Only)       Balance           Standing balance support: Bilateral upper extremity  supported;During functional activity Standing balance-Leahy Scale: Poor                      Cognition Arousal/Alertness: Awake/alert Behavior During Therapy: WFL for tasks assessed/performed Overall Cognitive Status: Difficult to assess Area of Impairment: Orientation;Attention;Following commands;Safety/judgement;Awareness;Problem solving Orientation Level: Disoriented to;Place;Time;Situation Current Attention Level: Focused   Following Commands: Follows one step commands inconsistently Safety/Judgement: Decreased awareness of safety;Decreased awareness of deficits   Problem Solving: Decreased initiation;Difficulty sequencing;Requires verbal cues;Requires tactile cues;Slow processing      Exercises      General Comments        Pertinent Vitals/Pain Pt unable to state    Home Living                      Prior Function            PT Goals (current goals can now be found in the care plan section) Progress towards PT goals: Progressing toward goals    Frequency  Min 3X/week    PT Plan Discharge plan needs to be updated    Co-evaluation             End of Session Equipment Utilized During Treatment: Gait belt Activity Tolerance: Patient tolerated treatment well Patient left: in bed;with call bell/phone within reach;with nursing/sitter in room     Time: 1005-1019 PT Time Calculation (min): 14 min  Charges:  $Gait Training: 8-22 mins                    G Codes:  Weston Anna, MPT Pager: [phone]

## 2014-01-26 NOTE — Progress Notes (Signed)
PROGRESS NOTE   Ebony MoraleJoann S Claar ZOX:[id number] DOB: 1941/05/11 DOA: 01/24/2014 PCP: PROVIDER NOT IN SYSTEM   Assessment/Plan:  Acute encephalopathy - Improving with IV fluids and current antibiotic regimen;  - Patient tested positive for C. difficile now on Flagyl  - Initially thought to be secondary to UTI although on review of urinalysis not overly convincing for source. Did report small amount of leukocytes. Urine culture negative. We'll discontinue broad-spectrum antibiotics and continue patient on oral Flagyl  - CT of head reported no acute intracranial abnormality  - CT of abdomen and pelvis reported no acute abnormality seen to explain the patient's symptoms. Did however report mild wall thickening of the rectum  ALZHEIMERS DISEASE - Now that patient is taking oral intake and has improvement patient will continue home regimen  HYPERTENSION - ARB held secondary to AKI  - Will continue beta blocker, blood pressure relatively well controlled on this regimen  AKI (acute kidney injury) - Resolved with IV fluid rehydration and discontinuation of ARB  Seizures - Not a patient is taking by mouth we'll continue home regimen  Hypokalemia - Replace orally and reassess next a.m.   Diet: DYS 3 Fluids: none DVT Prophylaxis: Lovenox  Code Status: Full Family Communication: husband bedside  Disposition Plan: SNF when ready   Consultants:  none  Procedures:  none   Antibiotics Vancomycin and cefepime>>>01/25/14  Metronidazole 4/6 >>  HPI/Subjective: No complaints, confused.  Objective: Filed Vitals:   01/25/14 0520 01/25/14 1354 01/25/14 2117 01/26/14 0507  BP: 120/74 126/80 151/78 152/81  Pulse: 93 76 61 65  Temp:  97.9 F (36.6 C) 98.1 F (36.7 C) 97.3 F (36.3 C)  TempSrc:  Axillary Axillary Axillary  Resp: 20 18 18 18   Height:      Weight:      SpO2: 94% 100% 100% 95%    Intake/Output Summary (Last 24 hours) at 01/26/14 0746 Last data filed at 01/26/14 0700  Gross per 24 hour  Intake    290 ml  Output      0 ml  Net    290 ml   Filed Weights   01/24/14 0608  Weight: 56.7 kg (125 lb)    Exam:   General:  NAD  Cardiovascular: regular rate and rhythm, without MRG  Respiratory: good air movement, clear to auscultation throughout, no wheezing, ronchi or rales  Abdomen: soft, not tender to palpation, positive bowel sounds  MSK: no peripheral edema  Neuro: not consistently following commands  Data Reviewed: Basic Metabolic Panel:  Recent Labs Lab 01/24/14 0115 01/24/14 0630 01/25/14 0815 01/26/14 0340  NA 153* 154* 138 137  K 4.5 4.1 3.1* 3.7  CL 113* 120* 102 102  CO2 22 21 23 21   GLUCOSE 106* 94 99 91  BUN 81* 64* 23 13  CREATININE 3.30* 2.21* 0.79 0.69  CALCIUM 10.3 8.3* 8.3* 8.4   Liver Function Tests:  Recent Labs Lab 01/24/14 0115 01/24/14 0630  AST 31 30  ALT 21 18  ALKPHOS 87 71  BILITOT 0.5 0.4  PROT 7.1 5.9*  ALBUMIN 3.7 3.0*   CBC:  Recent Labs Lab 01/24/14 0115 01/24/14 0630 01/26/14 0340  WBC 12.3* 10.2 7.2  NEUTROABS 8.6* 7.1  --   HGB 14.8 12.6 12.6  HCT 45.2 39.2 36.4  MCV 99.6 100.0 92.9  PLT 180 155 154   Cardiac Enzymes:  Recent Labs Lab 01/24/14 0115 01/24/14 0630  TROPONINI <0.30 <0.30   Recent Results (from the past 240  hour(s))  CULTURE, BLOOD (ROUTINE X 2)     Status: None   Collection Time    01/24/14  1:15 AM      Result Value Ref Range Status   Specimen Description BLOOD LEFT ARM   Final   Special Requests BOTTLES DRAWN AEROBIC AND ANAEROBIC 2CC   Final   Culture  Setup Time     Final   Value: 01/24/2014 08:40     Performed at [facility]   Culture     Final   Value:        BLOOD CULTURE RECEIVED NO GROWTH TO DATE CULTURE WILL BE HELD FOR 5 DAYS BEFORE ISSUING A FINAL NEGATIVE REPORT     Performed at [facility]   Report Status PENDING   Incomplete  URINE CULTURE     Status: None   Collection Time    01/24/14  1:17 AM      Result Value Ref  Range Status   Specimen Description URINE, CATHETERIZED   Final   Special Requests NONE   Final   Culture  Setup Time     Final   Value: 01/24/2014 09:16     Performed at [facility] Count     Final   Value: NO GROWTH     Performed at [facility]   Culture     Final   Value: NO GROWTH     Performed at [facility]   Report Status 01/25/2014 FINAL   Final  CULTURE, BLOOD (ROUTINE X 2)     Status: None   Collection Time    01/24/14  1:25 AM      Result Value Ref Range Status   Specimen Description BLOOD RIGHT ANTECUBITAL   Final   Special Requests BOTTLES DRAWN AEROBIC AND ANAEROBIC 10CC   Final   Culture  Setup Time     Final   Value: 01/24/2014 08:40     Performed at [facility]   Culture     Final   Value:        BLOOD CULTURE RECEIVED NO GROWTH TO DATE CULTURE WILL BE HELD FOR 5 DAYS BEFORE ISSUING A FINAL NEGATIVE REPORT     Performed at [facility]   Report Status PENDING   Incomplete  MRSA PCR SCREENING     Status: None   Collection Time    01/24/14  6:50 AM      Result Value Ref Range Status   MRSA by PCR NEGATIVE  NEGATIVE Final   Comment:            The GeneXpert MRSA Assay (FDA     approved for NASAL specimens     only), is one component of a     comprehensive MRSA colonization     surveillance program. It is not     intended to diagnose MRSA     infection nor to guide or     monitor treatment for     MRSA infections.  CLOSTRIDIUM DIFFICILE BY PCR     Status: Abnormal   Collection Time    01/24/14 10:27 AM      Result Value Ref Range Status   C difficile by pcr POSITIVE (*) NEGATIVE Final   Comment: CRITICAL RESULT CALLED TO, READ BACK BY AND VERIFIED WITH:     JESSICA DEUTSCH,RN AT 1351 01/24/14 BY K BARR     Performed at [facility]  Scheduled Meds: . citalopram  10 mg Oral Daily  . donepezil  5 mg Oral 3 times per day   And  . donepezil  10 mg Oral QAC breakfast  . enoxaparin (LOVENOX)  injection  30 mg Subcutaneous Q24H  . fluticasone  1 spray Each Nare QHS  . lamoTRIgine  25 mg Oral BID  . memantine  10 mg Oral BID  . metroNIDAZOLE  500 mg Oral 3 times per day  . nebivolol  5 mg Oral Daily  . pantoprazole  40 mg Oral Daily  . saccharomyces boulardii  250 mg Oral BID   Continuous Infusions:   Principal Problem:   Acute encephalopathy Active Problems:   ALZHEIMERS DISEASE   HYPERTENSION   AKI (acute kidney injury)   Seizures   Hypokalemia   Time spent: 35  This note has been created with Education officer, environmental. Any transcriptional errors are unintentional.   Pamella Pert, MD [facility] Pager [phone]. If 7 PM - 7 AM, please contact night-coverage at [email or web address], password [facility] 01/26/2014, 7:46 AM  LOS: 2 days

## 2014-01-27 DIAGNOSIS — I519 Heart disease, unspecified: Secondary | ICD-10-CM

## 2014-01-27 DIAGNOSIS — I495 Sick sinus syndrome: Secondary | ICD-10-CM

## 2014-01-27 DIAGNOSIS — R001 Bradycardia, unspecified: Secondary | ICD-10-CM

## 2014-01-27 LAB — BASIC METABOLIC PANEL
BUN: 10 mg/dL (ref 6–23)
CO2: 23 mEq/L (ref 19–32)
Calcium: 8.8 mg/dL (ref 8.4–10.5)
Chloride: 100 mEq/L (ref 96–112)
Creatinine, Ser: 0.71 mg/dL (ref 0.50–1.10)
GFR calc Af Amer: >90 mL/min (ref >90)
GFR, EST NON AFRICAN AMERICAN: 83 mL/min — AB (ref >90)
Glucose, Bld: 91 mg/dL (ref 70–99)
Potassium: 3.5 mEq/L — ABNORMAL LOW (ref 3.7–5.3)
SODIUM: 138 meq/L (ref 137–147)

## 2014-01-27 LAB — MAGNESIUM: Magnesium: 1.9 mg/dL (ref 1.5–2.5)

## 2014-01-27 MED ORDER — POTASSIUM CHLORIDE CRYS ER 20 MEQ PO TBCR
40.0000 meq | EXTENDED_RELEASE_TABLET | Freq: Once | ORAL | Status: AC
  Administered 2014-01-27: 40 meq via ORAL
  Filled 2014-01-27: qty 2

## 2014-01-27 MED ORDER — LORAZEPAM 2 MG/ML IJ SOLN
1.0000 mg | Freq: Once | INTRAMUSCULAR | Status: AC
  Administered 2014-01-27: 1 mg via INTRAVENOUS
  Filled 2014-01-27: qty 1

## 2014-01-27 MED ORDER — MAGNESIUM SULFATE 40 MG/ML IJ SOLN
2.0000 g | Freq: Once | INTRAMUSCULAR | Status: AC
  Administered 2014-01-27: 2 g via INTRAVENOUS
  Filled 2014-01-27: qty 50

## 2014-01-27 NOTE — Progress Notes (Signed)
Physical Therapy Treatment Patient Details Name: Arnoldo MoraleJoann S Kamphuis MRN: [id number] DOB: 1941-08-03 Today's Date: 01/27/2014    History of Present Illness [age] y female admitted with acute encephalopathy. Hx of dementia. seizures, HTN. [facility]     PT Comments    Pt alert X 1 able to follow simple functional repeated commands with increased time to process and complete.  Very slow moving.  For most part pt was non verbal (few words) but did have appropriate eye contact.  [facility] if pt needed as she is incont.  Amb pt in hallway with MAX assist due to severe posterior lean and poor self correction to midline.  Pt required "pushing" to advance gait distance and assistance correctly advancing RW.   HIGH FALL RISK.  Follow Up Recommendations  SNF(from Memory Unit)     Equipment Recommendations       Recommendations for Other Services       Precautions / Restrictions Precautions Precautions: Fall Precaution Comments: Hx dementia and swallowing percautions Restrictions Weight Bearing Restrictions: No    Mobility  Bed Mobility Overal bed mobility: Needs Assistance Bed Mobility: Supine to Sit     Supine to sit: Max assist     General bed mobility comments: increased, increased time and repeat simple functional cueing.  Pt very slow to respond and complete task.   Transfers Overall transfer level: Needs assistance Equipment used: None Transfers: Sit to/from Stand Sit to Stand: Max assist         General transfer comment: increased time to process dirctions and increased assist to complete 1/4 turn [facility].  Also required assist to decend sfaely and target correctly.  Ambulation/Gait Ambulation/Gait assistance: Max assist Ambulation Distance (Feet): 94 Feet Assistive device: Rolling walker (2 wheeled) Gait Pattern/deviations: Step-to pattern;Step-through pattern;Shuffle;Leaning posteriorly;Decreased stride length Gait  velocity: decreased   General Gait Details: severe posterior lean requiiring "push" to advance gait distance and prevent posterior LOB.  Very slow gait with zero/poor self corrective responce to midline.  HIGH FALL RISK.     Stairs            Wheelchair Mobility    Modified Rankin (Stroke Patients Only)       Balance                                    Cognition                            Exercises      General Comments        Pertinent Vitals/Pain     Home Living                      Prior Function            PT Goals (current goals can now be found in the care plan section) Progress towards PT goals: Progressing toward goals    Frequency  Min 3X/week    PT Plan      Co-evaluation             End of Session Equipment Utilized During Treatment: Gait belt Activity Tolerance: Patient tolerated treatment well Patient left: in chair;with call bell/phone within reach     Time: 1150-1215 PT Time Calculation (min): 25 min  Charges:  $Gait  Training: 8-22 mins $Therapeutic Activity: 8-22 mins                    G Codes:      Rica Koyanagi  PTA [facility]  Acute  Rehab Pager      [phone]

## 2014-01-27 NOTE — Progress Notes (Signed)
Echocardiogram 2D Echocardiogram has been performed.  Genene ChurnJames M Kmarion Rawl 01/27/2014, 4:16 PM

## 2014-01-27 NOTE — Consult Note (Signed)
CARDIOLOGY CONSULT NOTE  Patient ID: Ebony MoraleJoann S Valido MRN: [id number] DOB/AGE: 11/12/1940 73 y.o.  Admit date: 01/24/2014 Referring Physician: Dr Lafe GarinGherge  Primary Physician:  Primary Cardiologist: new Reason for Consultation: sinus pause/symptomatic bradycardia  HPI: 73 year old woman who is a resident of a Memory Unit for Alzheimer's disease, hospitalized with altered mental status. She was noted to be hypernatremic and also had acute renal failure.she has no past cardiac history. She does have hypertension and has been on a beta blocker. Today on telemetry she had a 5 second pause while her nurse was feeding her dinner. She had a very brief period of unresponsiveness, then quickly returned to her baseline.  On my evaluation, there is no family here. The patient is unable to provide any history whatsoever. She denies any pain.  Past Medical History  Diagnosis Date  . GERD (gastroesophageal reflux disease)   . Hypertension   . Chest pain, unspecified   . Alzheimer's dementia   . Chronic arthritis   . Postmenopausal   . Seizures   . Depression   . Hyperlipidemia   . Osteoporosis      Past Surgical History  Procedure Laterality Date  . Left hip replacement      following fx     History reviewed. No pertinent family history.  Social History: History   Social History  . Marital Status: Married    Spouse Name: N/A    Number of Children: N/A  . Years of Education: N/A   Occupational History  . Not on file.   Social History Main Topics  . Smoking status: Never Smoker   . Smokeless tobacco: Never Used  . Alcohol Use: No  . Drug Use: No  . Sexual Activity: Not on file   Other Topics Concern  . Not on file   Social History Narrative  . No narrative on file     Prior to Admission medications   Medication Sig Start Date End Date Taking? Authorizing Provider  acetaminophen (TYLENOL) 500 MG tablet Take 500 mg by mouth 3 (three) times daily.   Yes Historical  Provider, MD  Calcium Carb-Cholecalciferol (CALCIUM 600 + D) 600-200 MG-UNIT TABS Take 1 tablet by mouth daily.   Yes Historical Provider, MD  citalopram (CELEXA) 10 MG tablet Take 10 mg by mouth daily.   Yes Historical Provider, MD  donepezil (ARICEPT) 5 MG tablet Take 5-10 mg by mouth 4 (four) times daily. 2 tablets before breakfast, then 1 tablet at noon, 1 tablet at 4pm, and 1 tablet at 8 pm   Yes Historical Provider, MD  fexofenadine (ALLEGRA) 180 MG tablet Take 180 mg by mouth daily.   Yes Historical Provider, MD  fluticasone (FLONASE) 50 MCG/ACT nasal spray Place 1 spray into both nostrils at bedtime.   Yes Historical Provider, MD  gabapentin (NEURONTIN) 600 MG tablet Take 600 mg by mouth 2 (two) times daily.   Yes Historical Provider, MD  irbesartan (AVAPRO) 150 MG tablet Take 150 mg by mouth 2 (two) times daily.     Yes Historical Provider, MD  lamoTRIgine (LAMICTAL) 25 MG tablet Take 25 mg by mouth 2 (two) times daily.   Yes Historical Provider, MD  memantine (NAMENDA) 10 MG tablet Take 10 mg by mouth 2 (two) times daily. Take two tabs two times a day.    Yes Historical Provider, MD  nebivolol (BYSTOLIC) 5 MG tablet Take 1 tablet (5 mg total) by mouth daily. 06/18/11  Yes Damian LeavellWillie Ransom Stafford Jr.,  MD  omeprazole (PRILOSEC) 20 MG capsule Take 20 mg by mouth daily.   Yes Historical Provider, MD  QUEtiapine (SEROQUEL) 50 MG tablet Take 50 mg by mouth at bedtime.   Yes Historical Provider, MD  saccharomyces boulardii (FLORASTOR) 250 MG capsule Take 250 mg by mouth 2 (two) times daily. 7 day therapy course patient began on 01/22/14   Yes Historical Provider, MD  Vitamin D, Ergocalciferol, (DRISDOL) 50000 UNITS CAPS capsule Take 50,000 Units by mouth as directed. Every 2 weeks   Yes Historical Provider, MD    HOSPITAL MEDICATIONS: . citalopram  10 mg Oral Daily  . donepezil  5 mg Oral 3 times per day   And  . donepezil  10 mg Oral QAC breakfast  . enoxaparin (LOVENOX) injection  30 mg  Subcutaneous Q24H  . fluticasone  1 spray Each Nare QHS  . lamoTRIgine  25 mg Oral BID  . memantine  10 mg Oral BID  . metroNIDAZOLE  500 mg Oral 3 times per day  . pantoprazole  40 mg Oral Daily  . saccharomyces boulardii  250 mg Oral BID      ROS: Not obtainable secondary to advanced dementia    Physical Exam: Blood pressure 127/65, pulse 61, temperature 98.2 F (36.8 C), temperature source Oral, resp. rate 16, height 5' (1.524 m), weight 125 lb (56.7 kg), SpO2 98.00%.  Pt is elderly, not oriented, in no distress HEENT: normal Neck: JVP normal. Carotid upstrokes normal without bruits. No thyromegaly. Lungs: equal expansion, clear bilaterally CV: Apex is discrete and nondisplaced, RRR without murmur or gallop Abd: soft, NT, +BS Back: no CVA tenderness Ext: no C/C/E Skin: warm and dry without rash  Labs:   Lab Results  Component Value Date   WBC 7.2 01/26/2014   HGB 12.6 01/26/2014   HCT 36.4 01/26/2014   MCV 92.9 01/26/2014   PLT 154 01/26/2014    Recent Labs Lab 01/24/14 0630  01/27/14 0410  NA 154*  < > 138  K 4.1  < > 3.5*  CL 120*  < > 100  CO2 21  < > 23  BUN 64*  < > 10  CREATININE 2.21*  < > 0.71  CALCIUM 8.3*  < > 8.8  PROT 5.9*  --   --   BILITOT 0.4  --   --   ALKPHOS 71  --   --   ALT 18  --   --   AST 30  --   --   GLUCOSE 94  < > 91  < > = values in this interval not displayed. Lab Results  Component Value Date   CKTOTAL 54 09/13/2009   CKMB 1.2 09/13/2009   TROPONINI <0.30 01/24/2014    Lab Results  Component Value Date   CHOL 126 01/24/2014   CHOL 151 09/27/2010   CHOL 149 06/28/2009   Lab Results  Component Value Date   HDL 57 01/24/2014   HDL 76.20 09/27/2010   HDL 16.10 06/28/2009   Lab Results  Component Value Date   LDLCALC 54 01/24/2014   LDLCALC 63 09/27/2010   LDLCALC 58 06/28/2009   Lab Results  Component Value Date   TRIG 76 01/24/2014   TRIG 59.0 09/27/2010   TRIG 50.0 06/28/2009   Lab Results  Component Value Date   CHOLHDL 2.2  01/24/2014   CHOLHDL 2 09/27/2010   CHOLHDL 2 06/28/2009   Lab Results  Component Value Date   LDLDIRECT 142.9 06/23/2008  LDLDIRECT 114.5 06/09/2007      Radiology: No results found.  EKG: 01/24/2014: Normal sinus rhythm with prolonged QT interval  Telemetry: This demonstrates sinus rhythm. There are a few short runs of nonsustained VT, but several runs labeled is VT that are artifact. There is a 5 second sinus pause.  2-D echocardiogram: Study Conclusions  - Procedure narrative: Transthoracic echocardiography. Image quality was fair. The study was technically difficult, as a result of poor patient compliance. - Left ventricle: Abnormal septal motion The cavity size was normal. Systolic function was normal. The estimated ejection fraction was 55%. Wall motion was normal; there were no regional wall motion abnormalities. Doppler parameters are consistent with abnormal left ventricular relaxation (grade 1 diastolic dysfunction). - Atrial septum: No defect or patent foramen ovale was identified.  ASSESSMENT AND PLAN:  73 year old woman with sinus node dysfunction and a prolonged sinus pause of 5 seconds. She has advanced dementia and would not be a candidate in my opinion for any invasive cardiac procedures including pacemaker placement. I agree with discontinuing her beta blocker. She should not be started back on any AV nodal blocking agents or antihypertensive drugs that can cause AV block/bradycardia (beta blockers, calcium blockers, clonidine). I would follow her on telemetry for 24 hours off of her beta blocker and hopefully she will have no further bradycardic events. If she continues to have bradycardia, might also consider discontinuation of Aricept which can cause bradyarrhythmias. Will follow with you thx.  Tonny Bollman 01/27/2014, 6:52 PM

## 2014-01-27 NOTE — Progress Notes (Signed)
Patient had an episode of SVT with HR of 154. Patient asymptomatic. Vitals are stable. Schorr, NP notified. New order placed. Will continue to monitor.

## 2014-01-27 NOTE — Progress Notes (Addendum)
PROGRESS NOTE   Ebony [name] ZOX:[id number] DOB: 08-23-1941 DOA: 01/24/2014 PCP: PROVIDER NOT IN SYSTEM   Assessment/Plan:  Acute encephalopathy - Improving with IV fluids and current antibiotic regimen;  - Patient tested positive for C. difficile now on Flagyl  - Initially thought to be secondary to UTI although on review of urinalysis not overly convincing for source. Did report small amount of leukocytes. Urine culture negative. We'll discontinue broad-spectrum antibiotics and continue patient on oral Flagyl  - CT of head reported no acute intracranial abnormality  - CT of abdomen and pelvis reported no acute abnormality seen to explain the patient's symptoms. Did however report mild wall thickening of the rectum  ALZHEIMERS DISEASE - Now that patient is taking oral intake and has improvement patient will continue home regimen  HYPERTENSION - ARB held secondary to AKI  - Will continue beta blocker, blood pressure relatively well controlled on this regimen  AKI (acute kidney injury) - Resolved with IV fluid rehydration and discontinuation of ARB  Seizures -  continue home regimen  Hypokalemia - Replace orally and reassess next a.m. NSVT - magnesium, 2D echo   Diet: DYS 3 Fluids: none DVT Prophylaxis: Lovenox  Code Status: Full Family Communication: husband bedside  Disposition Plan: SNF when ready   Consultants:  none  Procedures:  none   Antibiotics Vancomycin and cefepime>>>01/25/14  Metronidazole 4/6 >>  HPI/Subjective: No complaints, confused.  Objective: Filed Vitals:   01/27/14 0002 01/27/14 0345 01/27/14 1029 01/27/14 1347  BP: 124/78 135/59 129/70 127/72  Pulse: 69 62 72 77  Temp: 98.1 F (36.7 C) 98 F (36.7 C) 97.4 F (36.3 C) 98.2 F (36.8 C)  TempSrc: Oral Oral Axillary Oral  Resp: 18 18 16 18   Height:      Weight:      SpO2: 98% 98% 98% 97%    Intake/Output Summary (Last 24 hours) at 01/27/14 1604 Last data filed at 01/27/14 1100  Gross per 24 hour  Intake    300 ml  Output      0 ml  Net    300 ml   Filed Weights   01/24/14 0608  Weight: 56.7 kg (125 lb)   Exam:  General:  NAD  Cardiovascular: regular rate and rhythm, without MRG  Respiratory: good air movement, clear to auscultation throughout, no wheezing, ronchi or rales  Abdomen: soft, not tender to palpation, positive bowel sounds  MSK: no peripheral edema  Neuro: not consistently following commands  Data Reviewed: Basic Metabolic Panel:  Recent Labs Lab 01/24/14 0115 01/24/14 0630 01/25/14 0815 01/26/14 0340 01/27/14 0410  NA 153* 154* 138 137 138  K 4.5 4.1 3.1* 3.7 3.5*  CL 113* 120* 102 102 100  CO2 22 21 23 21 23   GLUCOSE 106* 94 99 91 91  BUN 81* 64* 23 13 10   CREATININE 3.30* 2.21* 0.79 0.69 0.71  CALCIUM 10.3 8.3* 8.3* 8.4 8.8  MG  --   --   --   --  1.9   Liver Function Tests:  Recent Labs Lab 01/24/14 0115 01/24/14 0630  AST 31 30  ALT 21 18  ALKPHOS 87 71  BILITOT 0.5 0.4  PROT 7.1 5.9*  ALBUMIN 3.7 3.0*   CBC:  Recent Labs Lab 01/24/14 0115 01/24/14 0630 01/26/14 0340  WBC 12.3* 10.2 7.2  NEUTROABS 8.6* 7.1  --   HGB 14.8 12.6 12.6  HCT 45.2 39.2 36.4  MCV 99.6 100.0 92.9  PLT 180 155 154  Cardiac Enzymes:  Recent Labs Lab 01/24/14 0115 01/24/14 0630  TROPONINI <0.30 <0.30   Recent Results (from the past 240 hour(s))  CULTURE, BLOOD (ROUTINE X 2)     Status: None   Collection Time    01/24/14  1:15 AM      Result Value Ref Range Status   Specimen Description BLOOD LEFT ARM   Final   Special Requests BOTTLES DRAWN AEROBIC AND ANAEROBIC 2CC   Final   Culture  Setup Time     Final   Value: 01/24/2014 08:40     Performed at [facility]   Culture     Final   Value:        BLOOD CULTURE RECEIVED NO GROWTH TO DATE CULTURE WILL BE HELD FOR 5 DAYS BEFORE ISSUING A FINAL NEGATIVE REPORT     Performed at [facility]   Report Status PENDING   Incomplete  URINE CULTURE      Status: None   Collection Time    01/24/14  1:17 AM      Result Value Ref Range Status   Specimen Description URINE, CATHETERIZED   Final   Special Requests NONE   Final   Culture  Setup Time     Final   Value: 01/24/2014 09:16     Performed at [facility] Count     Final   Value: NO GROWTH     Performed at [facility]   Culture     Final   Value: NO GROWTH     Performed at [facility]   Report Status 01/25/2014 FINAL   Final  CULTURE, BLOOD (ROUTINE X 2)     Status: None   Collection Time    01/24/14  1:25 AM      Result Value Ref Range Status   Specimen Description BLOOD RIGHT ANTECUBITAL   Final   Special Requests BOTTLES DRAWN AEROBIC AND ANAEROBIC 10CC   Final   Culture  Setup Time     Final   Value: 01/24/2014 08:40     Performed at [facility]   Culture     Final   Value:        BLOOD CULTURE RECEIVED NO GROWTH TO DATE CULTURE WILL BE HELD FOR 5 DAYS BEFORE ISSUING A FINAL NEGATIVE REPORT     Performed at [facility]   Report Status PENDING   Incomplete  MRSA PCR SCREENING     Status: None   Collection Time    01/24/14  6:50 AM      Result Value Ref Range Status   MRSA by PCR NEGATIVE  NEGATIVE Final   Comment:            The GeneXpert MRSA Assay (FDA     approved for NASAL specimens     only), is one component of a     comprehensive MRSA colonization     surveillance program. It is not     intended to diagnose MRSA     infection nor to guide or     monitor treatment for     MRSA infections.  CLOSTRIDIUM DIFFICILE BY PCR     Status: Abnormal   Collection Time    01/24/14 10:27 AM      Result Value Ref Range Status   C difficile by pcr POSITIVE (*) NEGATIVE Final   Comment: CRITICAL RESULT CALLED TO, READ BACK BY AND VERIFIED WITH:  JESSICA DEUTSCH,RN AT 1351 01/24/14 BY K BARR     [facility]    Scheduled Meds: . citalopram  10 mg Oral Daily  . donepezil  5 mg Oral 3 times per day    And  . donepezil  10 mg Oral QAC breakfast  . enoxaparin (LOVENOX) injection  30 mg Subcutaneous Q24H  . fluticasone  1 spray Each Nare QHS  . lamoTRIgine  25 mg Oral BID  . memantine  10 mg Oral BID  . metroNIDAZOLE  500 mg Oral 3 times per day  . nebivolol  5 mg Oral Daily  . pantoprazole  40 mg Oral Daily  . saccharomyces boulardii  250 mg Oral BID   Continuous Infusions:   Principal Problem:   Acute encephalopathy Active Problems:   ALZHEIMERS DISEASE   HYPERTENSION   AKI (acute kidney injury)   Seizures   Hypokalemia  Time spent: 25  This note has been created with Education officer, environmentalDragon speech recognition software and smart phrase technology. Any transcriptional errors are unintentional.   Pamella Pertostin Gherghe, MD [facility] Pager [phone]. If 7 PM - 7 AM, please contact night-coverage at [email or web address], password [email or web address] 01/27/2014, 4:04 PM  LOS: 3 days    Addendum: 5 second pause on telemetry. Discontinued bystolic. Cardiology consult.

## 2014-01-28 DIAGNOSIS — I498 Other specified cardiac arrhythmias: Secondary | ICD-10-CM

## 2014-01-28 DIAGNOSIS — M549 Dorsalgia, unspecified: Secondary | ICD-10-CM

## 2014-01-28 DIAGNOSIS — A0472 Enterocolitis due to Clostridium difficile, not specified as recurrent: Secondary | ICD-10-CM

## 2014-01-28 LAB — CBC
HCT: 37.2 % (ref 36.0–46.0)
Hemoglobin: 13.2 g/dL (ref 12.0–15.0)
MCH: 32.8 pg (ref 26.0–34.0)
MCHC: 35.5 g/dL (ref 30.0–36.0)
MCV: 92.5 fL (ref 78.0–100.0)
PLATELETS: 192 10*3/uL (ref 150–400)
RBC: 4.02 MIL/uL (ref 3.87–5.11)
RDW: 12.5 % (ref 11.5–15.5)
WBC: 6.7 10*3/uL (ref 4.0–10.5)

## 2014-01-28 LAB — POTASSIUM: Potassium: 4.4 mEq/L (ref 3.7–5.3)

## 2014-01-28 MED ORDER — METRONIDAZOLE 500 MG PO TABS: 500.0000 mg | ORAL_TABLET | Freq: Three times a day (TID) | ORAL | Status: DC

## 2014-01-28 MED ORDER — LORAZEPAM 2 MG/ML IJ SOLN: 1.0000 mg | Freq: Once | INTRAMUSCULAR | Status: DC

## 2014-01-28 NOTE — Progress Notes (Signed)
Physical Therapy Treatment Patient Details Name: Ebony MoraleJoann S Burbridge MRN: [id number] DOB: 1941/10/17 Today's Date: 01/28/2014    History of Present Illness [age] y female admitted with acute encephalopathy. Hx of dementia. seizures, HTN. [facility]     PT Comments    Pt in bed rolling around.  AxO X 1.  Assisted OOB required MAX encouragement to increase self assist and decrease agitation.  Pt more sprite than yesterday.  Using a calm voice [facility] with no results than amb in hallway.  Demonstrated less posterior lean today but still unsteady gait requiring assit to prevent fall.  Returned pt to bed with alarm set.   Follow Up Recommendations  SNF (return to her Memory Unit)     Equipment Recommendations       Recommendations for Other Services       Precautions / Restrictions Precautions Precautions: Fall Precaution Comments: Hx dementia and swallowing percautions Restrictions Weight Bearing Restrictions: No    Mobility  Bed Mobility Overal bed mobility: Needs Assistance Bed Mobility: Supine to Sit;Sit to Supine     Supine to sit: Max assist Sit to supine: Mod assist   General bed mobility comments: Pt required increased encouragement to participate and repeat simple cueing to complete task.   Transfers Overall transfer level: Needs assistance Equipment used: None Transfers: Sit to/from Stand Sit to Stand: Max assist         General transfer comment: increased time to process dirctions and increased assist to complete 1/4 turn [facility].  Also required assist to sit as pt resisted.  Ambulation/Gait   Ambulation Distance (Feet): 105 Feet Assistive device: Rolling walker (2 wheeled) Gait Pattern/deviations: Step-to pattern;Step-through pattern;Decreased stride length;Shuffle Gait velocity: decreased   General Gait Details: less posterior lean this session but still required assist for safety and support due to poor  balance and poor self righting.  Also, impaired cognition due to Alzheimers required assist for direction.    Stairs            Wheelchair Mobility    Modified Rankin (Stroke Patients Only)       Balance                                    Cognition                            Exercises      General Comments        Pertinent Vitals/Pain     Home Living                      Prior Function            PT Goals (current goals can now be found in the care plan section) Progress towards PT goals: Progressing toward goals    Frequency  Min 3X/week    PT Plan      Co-evaluation             End of Session Equipment Utilized During Treatment: Gait belt Activity Tolerance: Treatment limited secondary to medical complications (Comment) (Alzheimers/dementia) Patient left: in bed;with call bell/phone within reach;with bed alarm set     Time: 0945-1010 PT Time Calculation (min): 25 min  Charges:  $Gait Training: 8-22 mins $Therapeutic Activity: 8-22 mins  G Codes:      Rica Koyanagi  PTA [facility]  Acute  Rehab Pager      [phone]

## 2014-01-28 NOTE — Progress Notes (Signed)
Patient cleared for discharge. Patient needs snf. CS[facility]. Patient will go to her same bed since it is a private room and she is on c diff precautions but will get therapy. [place]. ptar called for transportaiton. Message left for husband.  Wealthy Danielski C. Keiffer Piper MSW, LCSW [phone]

## 2014-01-28 NOTE — Progress Notes (Signed)
       Patient Name: Arnoldo MoraleJoann S Shelton Date of Encounter: 01/28/2014    SUBJECTIVE: No conversation  TELEMETRY:   NSR no pauses greater than 3 seconds. No rebound tachycardia off beta blocker Filed Vitals:   01/27/14 1347 01/27/14 1734 01/27/14 2204 01/28/14 0415  BP: 127/72 127/65 122/62 123/65  Pulse: 77 61 61 71  Temp: 98.2 F (36.8 C)  98.3 F (36.8 C) 98.2 F (36.8 C)  TempSrc: Oral  Oral Oral  Resp: 18 16 18 18   Height:      Weight:      SpO2: 97% 98% 98% 98%    Intake/Output Summary (Last 24 hours) at 01/28/14 0753 Last data filed at 01/28/14 0300  Gross per 24 hour  Intake    240 ml  Output      0 ml  Net    240 ml   LABS: Basic Metabolic Panel:  Recent Labs  16/07/9603/08/15 0340 01/27/14 0410 01/28/14 0430  NA 137 138  --   K 3.7 3.5* 4.4  CL 102 100  --   CO2 21 23  --   GLUCOSE 91 91  --   BUN 13 10  --   CREATININE 0.69 0.71  --   CALCIUM 8.4 8.8  --   MG  --  1.9  --    CBC:  Recent Labs  01/26/14 0340  WBC 7.2  HGB 12.6  HCT 36.4  MCV 92.9  PLT 154     Radiology/Studies:  None  Physical Exam: Blood pressure 123/65, pulse 71, temperature 98.2 F (36.8 C), temperature source Oral, resp. rate 18, height 5' (1.524 m), weight 125 lb (56.7 kg), SpO2 98.00%. Weight change:   Wt Readings from Last 3 Encounters:  01/24/14 125 lb (56.7 kg)  04/17/11 138 lb (62.596 kg)  09/27/10 137 lb (62.143 kg)    2/6 systolic murmur  ASSESSMENT:  1. Sinus node dysfunction, now off beta blocker with no rebound tachycardia and no recurrence of symptomatic pauses.  Plan:  1. No AV or SA node blocking agents should be used. 2. Okay to stop telemetry.  Signed, Lyn RecordsHenry W Delaney Perona III 01/28/2014, 7:53 AM

## 2014-01-28 NOTE — Progress Notes (Signed)
Report called to Ellsworth LennoxSam Middleton RN @ [facility].

## 2014-01-28 NOTE — Discharge Summary (Addendum)
Physician Discharge Summary  Ebony [name] ZOX:[id number] DOB: 14-May-1941 DOA: 01/24/2014  PCP: PROVIDER NOT IN SYSTEM  Admit date: 01/24/2014 Discharge date: 01/28/2014  Time spent: 35 minutes  Recommendations for Outpatient Follow-up:  1. Follow up with PCP in 1 week  2. Avoid beta blockers or other AV or SA nodal agents 3. Continue Metronidazole for 10 additional days  Recommendations for primary care physician for things to follow:  Repeat BMP  Discharge Diagnoses:  Principal Problem:   Acute encephalopathy Active Problems:   ALZHEIMERS DISEASE   HYPERTENSION   AKI (acute kidney injury)   Seizures   Hypokalemia   Sinus node dysfunction   Symptomatic bradycardia   Enteritis due to Clostridium difficile  Discharge Condition: stable  Diet recommendation: as tolerated  Filed Weights   01/24/14 0608  Weight: 56.7 kg (125 lb)    History of present illness:  Ebony [name] is a 73 y.o. female history of dementia, complex partial seizures, hypertension was brought from the nursing home after patient was found to be increasingly lethargic. As per patient's husband patient has been getting increasingly weak over the last few days and yesterday had labs drawn which showed possible UTI and was placed on antibiotics. Today patient in the ER was found to be lethargic initially with hypotension. Patient was given fluid boluses after which patient became more alert. Labs revealed hypernatremia with acute renal failure. Labs from yesterday showed a creatinine of 1.8 today it is around 3. CTA did not show anything acute. Patient has myoclonic jerks. Patient has been having tremors which has per husband has been getting worse and patient's neurologist had decreased her Neurontin from 600 mg 3 times daily to twice a day 6 weeks ago. Patient also was noticed to have nausea vomiting at her nursing facility. On my exam patient presently is oriented to her name and follows commands. Has tremors.  Chest x-ray does not show any acute. Patient has been started on empiric antibiotics. As per patient's husband she has been walking until yesterday.  Hospital Course:  Acute encephalopathy - Improving with IV fluids and current antibiotic regimen;  - CT of head reported no acute intracranial abnormality  - CT of abdomen and pelvis reported no acute abnormality seen to explain the patient's symptoms. Did however report mild wall thickening of the rectum which may be explained by #2. C diff infection - Patient tested positive for C. difficile now on Flagyl, started on 4/6, to complete 10 additional days after discharge. Patient's diarrhea has improved, and she is able to eat without nausea/vomiting or abdominal pain.  ALZHEIMERS DISEASE - Now that patient is taking oral intake and has improvement patient will continue home regimen  HYPERTENSION - ARB held secondary to AKI  Pause - patient with one 5 second sinus pause. This is likely due to her Nebivolol, which has been discontinued without further events. Cardiology was consulted as well and evaluated patient while hospitalized. She underwent a 2D echo, results below.  AKI (acute kidney injury) - Resolved with IV fluid rehydration and discontinuation of ARB  Seizures - continue home regimen  Hypokalemia - Replace orally, K normal on discharge  Procedures:  2D echo  Study Conclusions - Procedure narrative: Transthoracic echocardiography. Image quality was fair. The study was technically difficult, as a result of poor patient compliance. - Left ventricle: Abnormal septal motion The cavity size was normal. Systolic function was normal. The estimated ejection fraction was 55%. Wall motion was normal; there were  no regional wall motion abnormalities. Doppler parameters are consistent with abnormal left ventricular relaxation (grade 1 diastolic dysfunction). - Atrial septum: No defect or patent foramen ovale was  identified.  Consultations:  Cardiology  Discharge Exam: Filed Vitals:   01/27/14 1734 01/27/14 2204 01/28/14 0415 01/28/14 0800  BP: 127/65 122/62 123/65   Pulse: 61 61 71 72  Temp:  98.3 F (36.8 C) 98.2 F (36.8 C)   TempSrc:  Oral Oral   Resp: 16 18 18 18   Height:      Weight:      SpO2: 98% 98% 98%    General: NAD Cardiovascular: RRR Respiratory: CTA biL  Discharge Instructions    Medication List    STOP taking these medications       nebivolol 5 MG tablet  Commonly known as:  BYSTOLIC      TAKE these medications       acetaminophen 500 MG tablet  Commonly known as:  TYLENOL  Take 500 mg by mouth 3 (three) times daily.     CALCIUM 600 + D 600-200 MG-UNIT Tabs  Generic drug:  Calcium Carb-Cholecalciferol  Take 1 tablet by mouth daily.     citalopram 10 MG tablet  Commonly known as:  CELEXA  Take 10 mg by mouth daily.     donepezil 5 MG tablet  Commonly known as:  ARICEPT  Take 5-10 mg by mouth 4 (four) times daily. 2 tablets before breakfast, then 1 tablet at noon, 1 tablet at 4pm, and 1 tablet at 8 pm     fexofenadine 180 MG tablet  Commonly known as:  ALLEGRA  Take 180 mg by mouth daily.     fluticasone 50 MCG/ACT nasal spray  Commonly known as:  FLONASE  Place 1 spray into both nostrils at bedtime.     gabapentin 600 MG tablet  Commonly known as:  NEURONTIN  Take 600 mg by mouth 2 (two) times daily.     irbesartan 150 MG tablet  Commonly known as:  AVAPRO  Take 150 mg by mouth 2 (two) times daily.     lamoTRIgine 25 MG tablet  Commonly known as:  LAMICTAL  Take 25 mg by mouth 2 (two) times daily.     memantine 10 MG tablet  Commonly known as:  NAMENDA  Take 10 mg by mouth 2 (two) times daily. Take two tabs two times a day.     metroNIDAZOLE 500 MG tablet  Commonly known as:  FLAGYL  Take 1 tablet (500 mg total) by mouth every 8 (eight) hours.     omeprazole 20 MG capsule  Commonly known as:  PRILOSEC  Take 20 mg by mouth daily.      QUEtiapine 50 MG tablet  Commonly known as:  SEROQUEL  Take 50 mg by mouth at bedtime.     saccharomyces boulardii 250 MG capsule  Commonly known as:  FLORASTOR  Take 250 mg by mouth 2 (two) times daily. 7 day therapy course patient began on 01/22/14     Vitamin D (Ergocalciferol) 50000 UNITS Caps capsule  Commonly known as:  DRISDOL  Take 50,000 Units by mouth as directed. Every 2 weeks         The results of significant diagnostics from this hospitalization (including imaging, microbiology, ancillary and laboratory) are listed below for reference.    Significant Diagnostic Studies: Ct Abdomen Pelvis Wo Contrast  01/24/2014   CLINICAL DATA:  Leukocytosis. White blood cells in the urine. Assess for hydronephrosis.  EXAM: CT ABDOMEN AND PELVIS WITHOUT CONTRAST  TECHNIQUE: Multidetector CT imaging of the abdomen and pelvis was performed following the standard protocol without intravenous contrast.  COMPARISON:  Pelvic ultrasound performed 10/11/2010, and CT of the abdomen and pelvis performed 02/17/2010  FINDINGS: Minimal left basilar atelectasis is noted. Scattered coronary artery calcifications are seen.  The liver and spleen are unremarkable in appearance. The gallbladder is within normal limits. The pancreas and adrenal glands are unremarkable.  A 2.4 cm cyst is noted at the anterior aspect of the left kidney. The kidneys are otherwise unremarkable in appearance. There is no evidence of hydronephrosis. No renal or ureteral stones are seen. No perinephric stranding is appreciated.  No free fluid is identified. The small bowel is unremarkable in appearance. The stomach is within normal limits. No acute vascular abnormalities are seen. Mild scattered calcification is seen along the abdominal aorta.  The appendix is not definitely seen; there is no evidence of appendicitis. The colon is unremarkable in appearance. Mild distention of the rectum with dense stool is noted, measuring 7.2 cm in  transverse dimension, with associated mild wall thickening, raising question for mild proctitis.  The bladder is mildly distended and grossly unremarkable. The uterus demonstrates scattered calcification, nonspecific in appearance. The ovaries are mildly asymmetric, with a likely physiologic 2.4 cm left adnexal cystic lesion seen; this is stable from 2011. No definite suspicious adnexal masses are identified. No inguinal lymphadenopathy is seen.  No acute osseous abnormalities are identified. There is mild chronic grade 1 anterolisthesis of L4 on L5, reflecting underlying facet disease. 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  IMPRESSION: 1. No acute abnormality seen to explain the patient's symptoms. 2. Distention of the rectum with dense stool to 7.2 cm, with associated mild wall thickening, raising question for mild proctitis. 3. Scattered coronary artery calcifications seen. 4. Left renal cyst seen. 5. Mild scattered calcification along the abdominal aorta.   Electronically Signed   By: Roanna RaiderJeffery  Chang M.D.   On: 01/24/2014 05:40   Dg Chest 1 View  01/24/2014   CLINICAL DATA:  Chest pain  EXAM: CHEST - 1 VIEW  COMPARISON:  Prior radiograph from 10/11/2010  FINDINGS: The cardiac and mediastinal silhouettes are stable in size and contour, and remain within normal limits. Tortuosity of the intrathoracic aorta noted with atherosclerotic calcifications present within the aortic arch.  The lungs are mildly hypoinflated. No airspace consolidation, pleural effusion, or pulmonary edema is identified. There is no pneumothorax.  No acute osseous abnormality identified.  IMPRESSION: No acute cardiopulmonary abnormality identified.   Electronically Signed   By: Rise MuBenjamin  McClintock M.D.   On: 01/24/2014 01:51   Ct Head Wo Contrast  01/24/2014   CLINICAL DATA:  Tremors; dehydration.  EXAM: CT HEAD WITHOUT CONTRAST  TECHNIQUE: Contiguous axial images were obtained from the base of the skull through the  vertex without intravenous contrast.  COMPARISON:  CT of the head performed 10/11/2010  FINDINGS: There is no evidence of acute infarction, mass lesion, or intra- or extra-axial hemorrhage on CT.  Prominence of the ventricles and sulci reflects moderate cortical volume loss. Cerebellar atrophy is noted. Diffuse periventricular and subcortical white matter change likely reflects small vessel ischemic microangiopathy. A small chronic lacunar infarct is noted at the left external capsule.  The brainstem and fourth ventricle are within normal limits. The cerebral hemispheres demonstrate grossly normal gray-white differentiation. No mass effect or midline shift is seen.  There is no evidence of fracture; visualized osseous structures  are unremarkable in appearance. The orbits are within normal limits. The paranasal sinuses and mastoid air cells are well-aerated. No significant soft tissue abnormalities are seen.  IMPRESSION: 1. No acute intracranial abnormality seen on CT. 2. Moderate cortical volume loss and diffuse small vessel ischemic microangiopathy. 3. 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   Electronically Signed   By: Roanna Raider M.D.   On: 01/24/2014 02:37    Microbiology: Recent Results (from the past 240 hour(s))  CULTURE, BLOOD (ROUTINE X 2)     Status: None   Collection Time    01/24/14  1:15 AM      Result Value Ref Range Status   Specimen Description BLOOD LEFT ARM   Final   Special Requests BOTTLES DRAWN AEROBIC AND ANAEROBIC 2CC   Final   Culture  Setup Time     Final   Value: 01/24/2014 08:40     Performed at [facility]   Culture     Final   Value:        BLOOD CULTURE RECEIVED NO GROWTH TO DATE CULTURE WILL BE HELD FOR 5 DAYS BEFORE ISSUING A FINAL NEGATIVE REPORT     Performed at [facility]   Report Status PENDING   Incomplete  URINE CULTURE     Status: None   Collection Time    01/24/14  1:17 AM      Result Value Ref Range Status    Specimen Description URINE, CATHETERIZED   Final   Special Requests NONE   Final   Culture  Setup Time     Final   Value: 01/24/2014 09:16     Performed at [facility] Count     Final   Value: NO GROWTH     Performed at [facility]   Culture     Final   Value: NO GROWTH     Performed at [facility]   Report Status 01/25/2014 FINAL   Final  CULTURE, BLOOD (ROUTINE X 2)     Status: None   Collection Time    01/24/14  1:25 AM      Result Value Ref Range Status   Specimen Description BLOOD RIGHT ANTECUBITAL   Final   Special Requests BOTTLES DRAWN AEROBIC AND ANAEROBIC 10CC   Final   Culture  Setup Time     Final   Value: 01/24/2014 08:40     Performed at [facility]   Culture     Final   Value:        BLOOD CULTURE RECEIVED NO GROWTH TO DATE CULTURE WILL BE HELD FOR 5 DAYS BEFORE ISSUING A FINAL NEGATIVE REPORT     Performed at [facility]   Report Status PENDING   Incomplete  MRSA PCR SCREENING     Status: None   Collection Time    01/24/14  6:50 AM      Result Value Ref Range Status   MRSA by PCR NEGATIVE  NEGATIVE Final   Comment:            The GeneXpert MRSA Assay (FDA     approved for NASAL specimens     only), is one component of a     comprehensive MRSA colonization     surveillance program. It is not     intended to diagnose MRSA     infection nor to guide or     monitor treatment for  MRSA infections.  CLOSTRIDIUM DIFFICILE BY PCR     Status: Abnormal   Collection Time    01/24/14 10:27 AM      Result Value Ref Range Status   C difficile by pcr POSITIVE (*) NEGATIVE Final   Comment: CRITICAL RESULT CALLED TO, READ BACK BY AND VERIFIED WITH:     JESSICA DEUTSCH,RN AT 1351 01/24/14 BY K BARR     Performed at [facility]     Labs: Basic Metabolic Panel:  Recent Labs Lab 01/24/14 0115 01/24/14 0630 01/25/14 0815 01/26/14 0340 01/27/14 0410 01/28/14 0430  NA 153* 154* 138 137 138  --   K 4.5  4.1 3.1* 3.7 3.5* 4.4  CL 113* 120* 102 102 100  --   CO2 22 21 23 21 23   --   GLUCOSE 106* 94 99 91 91  --   BUN 81* 64* 23 13 10   --   CREATININE 3.30* 2.21* 0.79 0.69 0.71  --   CALCIUM 10.3 8.3* 8.3* 8.4 8.8  --   MG  --   --   --   --  1.9  --    Liver Function Tests:  Recent Labs Lab 01/24/14 0115 01/24/14 0630  AST 31 30  ALT 21 18  ALKPHOS 87 71  BILITOT 0.5 0.4  PROT 7.1 5.9*  ALBUMIN 3.7 3.0*   CBC:  Recent Labs Lab 01/24/14 0115 01/24/14 0630 01/26/14 0340 01/28/14 0430  WBC 12.3* 10.2 7.2 6.7  NEUTROABS 8.6* 7.1  --   --   HGB 14.8 12.6 12.6 13.2  HCT 45.2 39.2 36.4 37.2  MCV 99.6 100.0 92.9 92.5  PLT 180 155 154 192   Cardiac Enzymes:  Recent Labs Lab 01/24/14 0115 01/24/14 0630  TROPONINI <0.30 <0.30    Signed:  Heidy Mccubbin M Anice Wilshire  [facility] 01/28/2014, 1:23 PM

## 2014-01-30 LAB — CULTURE, BLOOD (ROUTINE X 2)
CULTURE: NO GROWTH
CULTURE: NO GROWTH

## 2014-03-04 ENCOUNTER — Emergency Department (HOSPITAL_COMMUNITY): Payer: Medicare Other

## 2014-03-04 ENCOUNTER — Inpatient Hospital Stay (HOSPITAL_COMMUNITY)
Admission: EM | Admit: 2014-03-04 | Discharge: 2014-03-07 | DRG: 640 | Disposition: A | Discharge status: Skilled Nursing Facility | Payer: Medicare Other | Attending: Internal Medicine | Admitting: Internal Medicine

## 2014-03-04 ENCOUNTER — Encounter (HOSPITAL_COMMUNITY): Payer: Self-pay | Admitting: Emergency Medicine

## 2014-03-04 DIAGNOSIS — Z79899 Other long term (current) drug therapy: Secondary | ICD-10-CM

## 2014-03-04 DIAGNOSIS — F3289 Other specified depressive episodes: Secondary | ICD-10-CM | POA: Diagnosis present

## 2014-03-04 DIAGNOSIS — D649 Anemia, unspecified: Secondary | ICD-10-CM | POA: Diagnosis present

## 2014-03-04 DIAGNOSIS — E43 Unspecified severe protein-calorie malnutrition: Secondary | ICD-10-CM | POA: Insufficient documentation

## 2014-03-04 DIAGNOSIS — E785 Hyperlipidemia, unspecified: Secondary | ICD-10-CM | POA: Diagnosis present

## 2014-03-04 DIAGNOSIS — E872 Acidosis, unspecified: Secondary | ICD-10-CM | POA: Diagnosis not present

## 2014-03-04 DIAGNOSIS — E860 Dehydration: Secondary | ICD-10-CM | POA: Diagnosis present

## 2014-03-04 DIAGNOSIS — E870 Hyperosmolality and hypernatremia: Principal | ICD-10-CM | POA: Diagnosis present

## 2014-03-04 DIAGNOSIS — K219 Gastro-esophageal reflux disease without esophagitis: Secondary | ICD-10-CM | POA: Diagnosis present

## 2014-03-04 DIAGNOSIS — G309 Alzheimer's disease, unspecified: Secondary | ICD-10-CM | POA: Diagnosis present

## 2014-03-04 DIAGNOSIS — R1310 Dysphagia, unspecified: Secondary | ICD-10-CM | POA: Diagnosis present

## 2014-03-04 DIAGNOSIS — H6120 Impacted cerumen, unspecified ear: Secondary | ICD-10-CM | POA: Diagnosis present

## 2014-03-04 DIAGNOSIS — F329 Major depressive disorder, single episode, unspecified: Secondary | ICD-10-CM | POA: Diagnosis present

## 2014-03-04 DIAGNOSIS — M810 Age-related osteoporosis without current pathological fracture: Secondary | ICD-10-CM | POA: Diagnosis present

## 2014-03-04 DIAGNOSIS — R569 Unspecified convulsions: Secondary | ICD-10-CM | POA: Diagnosis present

## 2014-03-04 DIAGNOSIS — F0390 Unspecified dementia without behavioral disturbance: Secondary | ICD-10-CM

## 2014-03-04 DIAGNOSIS — IMO0002 Reserved for concepts with insufficient information to code with codable children: Secondary | ICD-10-CM

## 2014-03-04 DIAGNOSIS — E162 Hypoglycemia, unspecified: Secondary | ICD-10-CM | POA: Diagnosis present

## 2014-03-04 DIAGNOSIS — E876 Hypokalemia: Secondary | ICD-10-CM | POA: Diagnosis present

## 2014-03-04 DIAGNOSIS — F0280 Dementia in other diseases classified elsewhere without behavioral disturbance: Secondary | ICD-10-CM | POA: Diagnosis present

## 2014-03-04 DIAGNOSIS — R627 Adult failure to thrive: Secondary | ICD-10-CM | POA: Diagnosis present

## 2014-03-04 DIAGNOSIS — Z96649 Presence of unspecified artificial hip joint: Secondary | ICD-10-CM

## 2014-03-04 DIAGNOSIS — A0472 Enterocolitis due to Clostridium difficile, not specified as recurrent: Secondary | ICD-10-CM | POA: Diagnosis present

## 2014-03-04 DIAGNOSIS — I10 Essential (primary) hypertension: Secondary | ICD-10-CM | POA: Diagnosis present

## 2014-03-04 LAB — CBC WITH DIFFERENTIAL/PLATELET
BASOS PCT: 1 % (ref 0–1)
Basophils Absolute: 0 10*3/uL (ref 0.0–0.1)
EOS PCT: 4 % (ref 0–5)
Eosinophils Absolute: 0.2 10*3/uL (ref 0.0–0.7)
HEMATOCRIT: 39.6 % (ref 36.0–46.0)
HEMOGLOBIN: 12.7 g/dL (ref 12.0–15.0)
Lymphocytes Relative: 41 % (ref 12–46)
Lymphs Abs: 2.3 10*3/uL (ref 0.7–4.0)
MCH: 31.8 pg (ref 26.0–34.0)
MCHC: 32.1 g/dL (ref 30.0–36.0)
MCV: 99 fL (ref 78.0–100.0)
MONO ABS: 0.4 10*3/uL (ref 0.1–1.0)
Monocytes Relative: 8 % (ref 3–12)
Neutro Abs: 2.8 10*3/uL (ref 1.7–7.7)
Neutrophils Relative %: 48 % (ref 43–77)
Platelets: 186 10*3/uL (ref 150–400)
RBC: 4 MIL/uL (ref 3.87–5.11)
RDW: 13.6 % (ref 11.5–15.5)
WBC: 5.8 10*3/uL (ref 4.0–10.5)

## 2014-03-04 LAB — URINALYSIS, ROUTINE W REFLEX MICROSCOPIC
Bilirubin Urine: NEGATIVE
GLUCOSE, UA: NEGATIVE mg/dL
Hgb urine dipstick: NEGATIVE
Ketones, ur: NEGATIVE mg/dL
LEUKOCYTES UA: NEGATIVE
Nitrite: NEGATIVE
PH: 6 (ref 5.0–8.0)
Protein, ur: NEGATIVE mg/dL
Specific Gravity, Urine: 1.014 (ref 1.005–1.030)
Urobilinogen, UA: 0.2 mg/dL (ref 0.0–1.0)

## 2014-03-04 LAB — COMPREHENSIVE METABOLIC PANEL
ALBUMIN: 3.2 g/dL — AB (ref 3.5–5.2)
ALK PHOS: 78 U/L (ref 39–117)
ALT: 23 U/L (ref 0–35)
ALT: 24 U/L (ref 0–35)
AST: 40 U/L — AB (ref 0–37)
AST: 51 U/L — AB (ref 0–37)
Albumin: 3.1 g/dL — ABNORMAL LOW (ref 3.5–5.2)
Alkaline Phosphatase: 83 U/L (ref 39–117)
BILIRUBIN TOTAL: 0.7 mg/dL (ref 0.3–1.2)
BILIRUBIN TOTAL: 0.6 mg/dL (ref 0.3–1.2)
BUN: 20 mg/dL (ref 6–23)
BUN: 17 mg/dL (ref 6–23)
CHLORIDE: 114 meq/L — AB (ref 96–112)
CO2: 22 mEq/L (ref 19–32)
CO2: 23 meq/L (ref 19–32)
Calcium: 9.1 mg/dL (ref 8.4–10.5)
Calcium: 9.2 mg/dL (ref 8.4–10.5)
Chloride: 111 mEq/L (ref 96–112)
Creatinine, Ser: 1.1 mg/dL (ref 0.50–1.10)
Creatinine, Ser: 0.98 mg/dL (ref 0.50–1.10)
GFR calc Af Amer: 56 mL/min — ABNORMAL LOW (ref >90)
GFR calc Af Amer: 64 mL/min — ABNORMAL LOW (ref >90)
GFR calc non Af Amer: 48 mL/min — ABNORMAL LOW (ref >90)
GFR, EST NON AFRICAN AMERICAN: 55 mL/min — AB (ref >90)
Glucose, Bld: 67 mg/dL — ABNORMAL LOW (ref 70–99)
Glucose, Bld: 107 mg/dL — ABNORMAL HIGH (ref 70–99)
POTASSIUM: 3.3 meq/L — AB (ref 3.7–5.3)
POTASSIUM: 3.9 meq/L (ref 3.7–5.3)
SODIUM: 151 meq/L — AB (ref 137–147)
Sodium: 148 mEq/L — ABNORMAL HIGH (ref 137–147)
TOTAL PROTEIN: 6.8 g/dL (ref 6.0–8.3)
Total Protein: 6.9 g/dL (ref 6.0–8.3)

## 2014-03-04 LAB — PHOSPHORUS: Phosphorus: 2 mg/dL — ABNORMAL LOW (ref 2.3–4.6)

## 2014-03-04 LAB — CBC
HCT: 41 % (ref 36.0–46.0)
Hemoglobin: 13 g/dL (ref 12.0–15.0)
MCH: 31.7 pg (ref 26.0–34.0)
MCHC: 31.7 g/dL (ref 30.0–36.0)
MCV: 100 fL (ref 78.0–100.0)
Platelets: 211 10*3/uL (ref 150–400)
RBC: 4.1 MIL/uL (ref 3.87–5.11)
RDW: 13.8 % (ref 11.5–15.5)
WBC: 5.8 10*3/uL (ref 4.0–10.5)

## 2014-03-04 LAB — MAGNESIUM: Magnesium: 2 mg/dL (ref 1.5–2.5)

## 2014-03-04 LAB — MRSA PCR SCREENING: MRSA by PCR: NEGATIVE

## 2014-03-04 LAB — CBG MONITORING, ED: Glucose-Capillary: 58 mg/dL — ABNORMAL LOW (ref 70–99)

## 2014-03-04 MED ORDER — SODIUM CHLORIDE 0.9 % IJ SOLN: 3.0000 mL | Freq: Two times a day (BID) | INTRAMUSCULAR | Status: DC

## 2014-03-04 MED ORDER — CALCIUM CARBONATE-VITAMIN D 500-200 MG-UNIT PO TABS
1.0000 | ORAL_TABLET | Freq: Every day | ORAL | Status: DC
  Administered 2014-03-05 – 2014-03-07 (×3): 1 via ORAL
  Filled 2014-03-04 (×4): qty 1

## 2014-03-04 MED ORDER — DEXTROSE-NACL 5-0.45 % IV SOLN
INTRAVENOUS | Status: DC
  Administered 2014-03-04 – 2014-03-05 (×2): via INTRAVENOUS

## 2014-03-04 MED ORDER — ACETAMINOPHEN 325 MG PO TABS: 650.0000 mg | ORAL_TABLET | Freq: Four times a day (QID) | ORAL | Status: DC | PRN

## 2014-03-04 MED ORDER — LORATADINE 10 MG PO TABS
10.0000 mg | ORAL_TABLET | Freq: Every day | ORAL | Status: DC
  Administered 2014-03-05 – 2014-03-07 (×3): 10 mg via ORAL
  Filled 2014-03-04 (×3): qty 1

## 2014-03-04 MED ORDER — ACETAMINOPHEN 650 MG RE SUPP: 650.0000 mg | Freq: Four times a day (QID) | RECTAL | Status: DC | PRN

## 2014-03-04 MED ORDER — FLUTICASONE PROPIONATE 50 MCG/ACT NA SUSP
1.0000 | Freq: Every day | NASAL | Status: DC
  Filled 2014-03-04: qty 16

## 2014-03-04 MED ORDER — SODIUM CHLORIDE 0.9 % IV SOLN
Freq: Once | INTRAVENOUS | Status: AC
  Administered 2014-03-04: 16:00:00 via INTRAVENOUS

## 2014-03-04 MED ORDER — POTASSIUM CHLORIDE CRYS ER 20 MEQ PO TBCR
20.0000 meq | EXTENDED_RELEASE_TABLET | Freq: Every day | ORAL | Status: DC
  Filled 2014-03-04 (×2): qty 1

## 2014-03-04 MED ORDER — DONEPEZIL HCL 5 MG PO TABS
5.0000 mg | ORAL_TABLET | ORAL | Status: DC
  Administered 2014-03-05 – 2014-03-07 (×6): 5 mg via ORAL
  Filled 2014-03-04 (×10): qty 1

## 2014-03-04 MED ORDER — QUETIAPINE FUMARATE 25 MG PO TABS
25.0000 mg | ORAL_TABLET | Freq: Every day | ORAL | Status: DC
  Filled 2014-03-04 (×4): qty 1

## 2014-03-04 MED ORDER — VITAMIN D (ERGOCALCIFEROL) 1.25 MG (50000 UNIT) PO CAPS: 50000.0000 [IU] | ORAL_CAPSULE | ORAL | Status: DC

## 2014-03-04 MED ORDER — SACCHAROMYCES BOULARDII 250 MG PO CAPS
250.0000 mg | ORAL_CAPSULE | Freq: Two times a day (BID) | ORAL | Status: DC
  Administered 2014-03-05 – 2014-03-07 (×3): 250 mg via ORAL
  Filled 2014-03-04 (×7): qty 1

## 2014-03-04 MED ORDER — LAMOTRIGINE 25 MG PO TABS
25.0000 mg | ORAL_TABLET | Freq: Two times a day (BID) | ORAL | Status: DC
  Administered 2014-03-05 – 2014-03-07 (×3): 25 mg via ORAL
  Filled 2014-03-04 (×8): qty 1

## 2014-03-04 MED ORDER — DEXTROSE 50 % IV SOLN
INTRAVENOUS | Status: AC
  Administered 2014-03-04: 50 mL via INTRAVENOUS
  Filled 2014-03-04: qty 50

## 2014-03-04 MED ORDER — GABAPENTIN 300 MG PO CAPS
600.0000 mg | ORAL_CAPSULE | Freq: Two times a day (BID) | ORAL | Status: DC
  Administered 2014-03-05 – 2014-03-07 (×3): 600 mg via ORAL
  Filled 2014-03-04 (×7): qty 2

## 2014-03-04 MED ORDER — DEXTROSE 50 % IV SOLN
1.0000 | Freq: Once | INTRAVENOUS | Status: AC
  Administered 2014-03-04: 50 mL via INTRAVENOUS

## 2014-03-04 MED ORDER — DEXTROSE-NACL 5-0.45 % IV SOLN: INTRAVENOUS | Status: DC

## 2014-03-04 MED ORDER — PANTOPRAZOLE SODIUM 40 MG PO TBEC
40.0000 mg | DELAYED_RELEASE_TABLET | Freq: Every day | ORAL | Status: DC
  Filled 2014-03-04: qty 1

## 2014-03-04 MED ORDER — VANCOMYCIN 50 MG/ML ORAL SOLUTION: 125.0000 mg | Freq: Every day | ORAL | Status: DC

## 2014-03-04 MED ORDER — CALCIUM CARB-CHOLECALCIFEROL 600-200 MG-UNIT PO TABS: 1.0000 | ORAL_TABLET | Freq: Every day | ORAL | Status: DC

## 2014-03-04 MED ORDER — ONDANSETRON HCL 4 MG/2ML IJ SOLN: 4.0000 mg | Freq: Four times a day (QID) | INTRAMUSCULAR | Status: DC | PRN

## 2014-03-04 MED ORDER — VANCOMYCIN 50 MG/ML ORAL SOLUTION
125.0000 mg | Freq: Four times a day (QID) | ORAL | Status: AC
  Administered 2014-03-04 – 2014-03-06 (×5): 125 mg via ORAL
  Filled 2014-03-04 (×6): qty 2.5

## 2014-03-04 MED ORDER — CITALOPRAM HYDROBROMIDE 10 MG PO TABS
10.0000 mg | ORAL_TABLET | Freq: Every day | ORAL | Status: DC
  Administered 2014-03-05 – 2014-03-07 (×3): 10 mg via ORAL
  Filled 2014-03-04 (×3): qty 1

## 2014-03-04 MED ORDER — VANCOMYCIN 50 MG/ML ORAL SOLUTION
125.0000 mg | Freq: Two times a day (BID) | ORAL | Status: DC
  Administered 2014-03-07: 125 mg via ORAL
  Filled 2014-03-04 (×3): qty 2.5

## 2014-03-04 MED ORDER — MEMANTINE HCL 10 MG PO TABS
10.0000 mg | ORAL_TABLET | Freq: Two times a day (BID) | ORAL | Status: DC
  Administered 2014-03-05 – 2014-03-07 (×3): 10 mg via ORAL
  Filled 2014-03-04 (×8): qty 1

## 2014-03-04 MED ORDER — ONDANSETRON HCL 4 MG PO TABS: 4.0000 mg | ORAL_TABLET | Freq: Four times a day (QID) | ORAL | Status: DC | PRN

## 2014-03-04 NOTE — ED Notes (Signed)
Pt in from [facility] by [facility]. Staff reports pt being treated for dehydration. IV fluids since Wednesday. Lost IV access today, unable to start another IV. Staff also reports poor PO intake, weakness. Currently being treated for C. Diff.

## 2014-03-04 NOTE — H&P (Signed)
[facility] History and Physical  Ebony [name] ZOX:[id number] DOB: [birth year] DOA: 03/04/2014  Referring physician: ER physician PCP: PROVIDER NOT IN SYSTEM; possible Dr. Marden Noble but no records on EPIC that he is patient's PCP  Chief Complaint: dehydration  HPI:  73 year old female from nursing home with past medical history of alzheimer's dementia, enteritis due to C.diff (on PO vanco until 03/06/2014), depression and seizure disorder who presented to [facility] ED 03/04/2014 from NH with reports of dehydration and weakness. Patient is not a good historian due to history of dementia and family is not at the bedside to provide details of medical history. Per NH staff, apparently patient has been receiving IV fluids for dehydration but have lost IV access and had to transfer her to hospital for further care. In ED, vitals are stable, BP 111/89, HR 65, T max 98.4 F and oxygen saturation 100% on room air. Blood work revealed sodium of 151, chloride 114 and potassium 3.3. She was started on IV fluids for hydration.  Assessment and Plan:  Principal Problem:   Dehydration - likely secondary to alzheimer's dementia - we will provide supportive care with IV fluids - check BMP in am Active Problems:   Hypernatremia - secondary to dehydration - continue IV fluids    Alzheimer's dementia  - continue namenda and Aricept    GERD - continue PPI therapy    DEPRESSION - continue seroquel 25 mg daily    Seizures - continue lamictal   Hypokalemia - being supplemented    Enteritis due to Clostridium difficile - continue vancomycin through 03/06/2014 - continue florastor    Radiological Exams on Admission: Dg Chest 2 View  03/04/2014  IMPRESSION: No active disease.   Ct Head Wo Contrast  03/04/2014  IMPRESSION: Limited due to motion artifact.  No acute intracranial process.  Moderate cortical volume loss.      Code Status: Full Family Communication: Pt at bedside Disposition Plan: Admit  for further evaluation  Alison Murray, MD  [facility] Pager [phone]  Review of Systems:  Unable to obtain due to patient's history of dementia   Past Medical History  Diagnosis Date  . GERD (gastroesophageal reflux disease)   . Hypertension   . Chest pain, unspecified   . Alzheimer's dementia   . Chronic arthritis   . Postmenopausal   . Seizures   . Depression   . Hyperlipidemia   . Osteoporosis    Past Surgical History  Procedure Laterality Date  . Left hip replacement      following fx   Social History:  reports that she has never smoked. She has never used smokeless tobacco. She reports that she does not drink alcohol or use illicit drugs.  Allergies  Allergen Reactions  . Amitriptyline     Per mar  . Wellbutrin [Bupropion]     Per mar    Family History: Unable to obtain due to patient's history of dementia   Prior to Admission medications   Medication Sig Start Date End Date Taking? Authorizing Provider  acetaminophen (TYLENOL) 500 MG tablet Take 500 mg by mouth 3 (three) times daily.   Yes Historical Provider, MD  Calcium Carb-Cholecalciferol (CALCIUM 600 + D) 600-200 MG-UNIT TABS Take 1 tablet by mouth daily.   Yes Historical Provider, MD  citalopram (CELEXA) 10 MG tablet Take 10 mg by mouth daily.   Yes Historical Provider, MD  donepezil (ARICEPT) 10 MG tablet Take 10 mg by mouth daily with breakfast.  Yes Historical Provider, MD  donepezil (ARICEPT) 5 MG tablet Take 5 mg by mouth 3 (three) times daily. 1200, 1600, 2000   Yes Historical Provider, MD  fexofenadine (ALLEGRA) 180 MG tablet Take 180 mg by mouth daily.   Yes Historical Provider, MD  fluticasone (FLONASE) 50 MCG/ACT nasal spray Place 1 spray into both nostrils at bedtime.   Yes Historical Provider, MD  gabapentin (NEURONTIN) 600 MG tablet Take 600 mg by mouth 2 (two) times daily.   Yes Historical Provider, MD  lamoTRIgine (LAMICTAL) 25 MG tablet Take 25 mg by mouth 2 (two) times daily.   Yes  Historical Provider, MD  memantine (NAMENDA) 10 MG tablet Take 10 mg by mouth 2 (two) times daily.    Yes Historical Provider, MD  omeprazole (PRILOSEC) 20 MG capsule Take 20 mg by mouth daily.   Yes Historical Provider, MD  potassium chloride SA (K-DUR,KLOR-CON) 20 MEQ tablet Take 20 mEq by mouth daily.   Yes Historical Provider, MD  PRESCRIPTION MEDICATION Take 1 each by mouth 2 (two) times daily. "magic cup" bid [facility]   Yes Historical Provider, MD  QUEtiapine (SEROQUEL) 25 MG tablet Take 25 mg by mouth at bedtime.   Yes Historical Provider, MD  saccharomyces boulardii (FLORASTOR) 250 MG capsule Take 250 mg by mouth 2 (two) times daily.    Yes Historical Provider, MD  vancomycin (VANCOCIN) 50 mg/mL oral solution Take 125 mg by mouth 4 (four) times daily. 02/25/14  Yes Historical Provider, MD  Vitamin D, Ergocalciferol, (DRISDOL) 50000 UNITS CAPS capsule Take 50,000 Units by mouth as directed. Every 2 weeks   Yes Historical Provider, MD   Physical Exam: Filed Vitals:   03/04/14 1435 03/04/14 1744 03/04/14 1747  BP: 111/89 132/70   Pulse: 76 65   Temp: 98.4 F (36.9 C)  98.1 F (36.7 C)  TempSrc: Oral  Oral  Resp: 14 14   SpO2: 100% 100%     Physical Exam  Constitutional: Appears malnourished, no acute distress  HENT: Normocephalic. External right and left ear normal. Dry mucus membranes  Eyes: Conjunctivae are normal. PERRLA, no scleral icterus.  Neck: Normal ROM. Neck supple. No JVD. No tracheal deviation. CVS: RRR, S1/S2 appreciated  Pulmonary: diminished breath sounds, no wheezing  Abdominal: Soft. BS +,  no distension, tenderness, rebound   Musculoskeletal: No edema or tenderness Lymphadenopathy: No lymphadenopathy noted, cervical, inguinal. Neuro: Alert. No focal neurologic deficits Skin: Skin is warm and dry. Poor skin turgor. Psychiatric: Normal mood and affect   Labs on Admission:  Basic Metabolic Panel:  Recent Labs Lab 03/04/14 1505  NA 151*  K 3.3*  CL 114*   CO2 22  GLUCOSE 67*  BUN 20  CREATININE 1.10  CALCIUM 9.1   Liver Function Tests:  Recent Labs Lab 03/04/14 1505  AST 40*  ALT 23  ALKPHOS 83  BILITOT 0.7  PROT 6.8  ALBUMIN 3.2*   No results found for this basename: LIPASE, AMYLASE,  in the last 168 hours No results found for this basename: AMMONIA,  in the last 168 hours CBC:  Recent Labs Lab 03/04/14 1505  WBC 5.8  HGB 13.0  HCT 41.0  MCV 100.0  PLT 211   Cardiac Enzymes: No results found for this basename: CKTOTAL, CKMB, CKMBINDEX, TROPONINI,  in the last 168 hours BNP: No components found with this basename: POCBNP,  CBG:  Recent Labs Lab 03/04/14 1742  GLUCAP 58*    If 7PM-7AM, please contact night-coverage [email or web address] Password [email or web address]  03/04/2014, 5:56 PM

## 2014-03-04 NOTE — ED Notes (Signed)
Bed: [id number] Expected date:  Expected time:  Means of arrival:  Comments: ems- elderly, failure to thrive

## 2014-03-04 NOTE — ED Provider Notes (Signed)
Medical screening examination/treatment/procedure(s) were conducted as a shared visit with non-physician practitioner(s) and myself.  I personally evaluated the patient during the encounter.   EKG Interpretation   Date/Time:  Friday Mar 04 2014 14:48:49 EDT Ventricular Rate:  70 PR Interval:  168 QRS Duration: 76 QT Interval:  424 QTC Calculation: 457 R Axis:   -27 Text Interpretation:  Sinus rhythm Inferior infarct, old Lateral leads are  also involved Baseline wander in lead(s) V2 No significant change since  last tracing Confirmed by Eirene Rather  MD, Elwood Bazinet [phone]([id number]) on 03/04/2014  3:16:09 PM      Presents to the ER at the request of nursing home for evaluation of possible dehydration. The patient has been receiving IV fluids for the last 2 days at the nursing home for presumed dehydration secondary to poor by mouth intake. The patient's IV access was lost today and they could not restart an IV so she was sent to the ER. Upon evaluation here, she is noted to be very somnolent and altered. She has significant hypernatremia, unclear etiology. Based on these mental status changes and electrolyte disturbance, she will be admitted to the hospital for further evaluation.  Ebony Creasehristopher J. Brittainy Bucker, MD 03/04/14 Rickey [id number]

## 2014-03-04 NOTE — ED Provider Notes (Signed)
CSN: [id number]     Arrival date & time 03/04/14  1434 History   First MD Initiated Contact with Patient 03/04/14 1505     Chief Complaint  Patient presents with  . Dehydration  . Weakness     (Consider location/radiation/quality/duration/timing/severity/associated sxs/prior Treatment) HPI  Pt with hx alzheimer's dementia, currently at [facility] after hospitalization last month for acute encephalopathy, acute kidney injury, possible UTI, and c.diff infection, presents with decreased PO intake that began 03/01/14.  Has been treated at her facility beginning two days ago for dehydration, lost IV access today and was unable to regain access.  She had no PO intake this morning and supervising physician requested transfer to ED for further evaluation of dehydration.    Level V caveat for dementia.   Past Medical History  Diagnosis Date  . GERD (gastroesophageal reflux disease)   . Hypertension   . Chest pain, unspecified   . Alzheimer's dementia   . Chronic arthritis   . Postmenopausal   . Seizures   . Depression   . Hyperlipidemia   . Osteoporosis    Past Surgical History  Procedure Laterality Date  . Left hip replacement      following fx   No family history on file. History  Substance Use Topics  . Smoking status: Never Smoker   . Smokeless tobacco: Never Used  . Alcohol Use: No   OB History   Grav Para Term Preterm Abortions TAB SAB Ect Mult Living                 Review of Systems  Unable to perform ROS: Dementia      Allergies  Amitriptyline and Wellbutrin  Home Medications   Prior to Admission medications   Medication Sig Start Date End Date Taking? Authorizing Provider  Calcium Carb-Cholecalciferol (CALCIUM 600 + D) 600-200 MG-UNIT TABS Take 1 tablet by mouth daily.   Yes Historical Provider, MD  citalopram (CELEXA) 10 MG tablet Take 10 mg by mouth daily.   Yes Historical Provider, MD  donepezil (ARICEPT) 10 MG tablet Take 10 mg by  mouth daily with breakfast.   Yes Historical Provider, MD  potassium chloride SA (K-DUR,KLOR-CON) 20 MEQ tablet Take 20 mEq by mouth daily.   Yes Historical Provider, MD  saccharomyces boulardii (FLORASTOR) 250 MG capsule Take 250 mg by mouth 2 (two) times daily.    Yes Historical Provider, MD  vancomycin (VANCOCIN) 50 mg/mL oral solution Take 125 mg by mouth 4 (four) times daily. 02/25/14  Yes Historical Provider, MD  Vitamin D, Ergocalciferol, (DRISDOL) 50000 UNITS CAPS capsule Take 50,000 Units by mouth as directed. Every 2 weeks   Yes Historical Provider, MD  acetaminophen (TYLENOL) 500 MG tablet Take 500 mg by mouth 3 (three) times daily.    Historical Provider, MD  fexofenadine (ALLEGRA) 180 MG tablet Take 180 mg by mouth daily.    Historical Provider, MD  fluticasone (FLONASE) 50 MCG/ACT nasal spray Place 1 spray into both nostrils at bedtime.    Historical Provider, MD  gabapentin (NEURONTIN) 600 MG tablet Take 600 mg by mouth 2 (two) times daily.    Historical Provider, MD  irbesartan (AVAPRO) 150 MG tablet Take 150 mg by mouth 2 (two) times daily.      Historical Provider, MD  lamoTRIgine (LAMICTAL) 25 MG tablet Take 25 mg by mouth 2 (two) times daily.    Historical Provider, MD  memantine (NAMENDA) 10 MG tablet Take 10 mg by mouth 2 (two)  times daily. Take two tabs two times a day.     Historical Provider, MD  omeprazole (PRILOSEC) 20 MG capsule Take 20 mg by mouth daily.    Historical Provider, MD   BP 111/89  Pulse 76  Temp(Src) 98.4 F (36.9 C) (Oral)  Resp 14  SpO2 100% Physical Exam  Nursing note and vitals reviewed. Constitutional: Vital signs are normal. No distress.  Thin, pale  HENT:  Head: Normocephalic and atraumatic.  Neck: Neck supple.  Cardiovascular: Normal rate and regular rhythm.   Pulmonary/Chest: Effort normal and breath sounds normal. No respiratory distress. She has no wheezes. She has no rales.  Abdominal: Soft. She exhibits no distension. There is no  tenderness. There is no rebound and no guarding.  Neurological: She is alert. GCS eye subscore is 4. GCS verbal subscore is 5. GCS motor subscore is 6.  Follows occasional commands.  Does not make eye contact.  Mumbles answers to questions that I am unable to understand. Equal grip strengths.   Skin: She is not diaphoretic.    ED Course  Procedures (including critical care time) Labs Review Labs Reviewed  COMPREHENSIVE METABOLIC PANEL - Abnormal; Notable for the following:    Sodium 151 (*)    Potassium 3.3 (*)    Chloride 114 (*)    Glucose, Bld 67 (*)    Albumin 3.2 (*)    AST 40 (*)    GFR calc non Af Amer 48 (*)    GFR calc Af Amer 56 (*)    All other components within normal limits  CBG MONITORING, ED - Abnormal; Notable for the following:    Glucose-Capillary 58 (*)    All other components within normal limits  CBC  URINALYSIS, ROUTINE W REFLEX MICROSCOPIC  LAMOTRIGINE LEVEL    Imaging Review Dg Chest 2 View  03/04/2014   CLINICAL DATA:  Dehydration and weakness.  EXAM: CHEST - 2 VIEW  COMPARISON:  DG CHEST 1 VIEW dated 01/24/2014  FINDINGS: The heart size and mediastinal contours are within normal limits. There is no evidence of pulmonary edema, consolidation, pneumothorax, nodule or pleural fluid. The visualized skeletal structures are unremarkable.  IMPRESSION: No active disease.   Electronically Signed   By: Irish LackGlenn  Yamagata M.D.   On: 03/04/2014 17:11   Ct Head Wo Contrast  03/04/2014   CLINICAL DATA:  Altered mental status, weakness  EXAM: CT HEAD WITHOUT CONTRAST  TECHNIQUE: Contiguous axial images were obtained from the base of the skull through the vertex without intravenous contrast.  COMPARISON:  CT HEAD W/O CM dated 01/24/2014; CT HEAD W/O CM dated 10/11/2010; CT HEAD W/O CM dated 09/11/2009  FINDINGS: Patient motion artifact limits evaluation. No evidence for acute cortically based infarct, mass lesion or intra axial or extra-axial hemorrhage. Re- demonstrated  prominence of the ventricular system and sulci compatible with volume loss.  Re- demonstrated diffuse periventricular and subcortical white matter hypodensity compatible with chronic small vessel ischemic change. 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 Orbits are grossly unremarkable. Paranasal sinuses are well aerated. Calvarium is intact.  IMPRESSION: Limited due to motion artifact.  No acute intracranial process.  Moderate cortical volume loss.   Electronically Signed   By: Annia Beltrew  Davis M.D.   On: 03/04/2014 17:39     EKG Interpretation   Date/Time:  Friday Mar 04 2014 14:48:49 EDT Ventricular Rate:  70 PR Interval:  168 QRS Duration: 76 QT Interval:  424 QTC Calculation: 457 R Axis:   -  27 Text Interpretation:  Sinus rhythm Inferior infarct, old Lateral leads are  also involved Baseline wander in lead(s) V2 No significant change since  last tracing Confirmed by POLLINA  MD, CHRISTOPHER [phone]) on 03/04/2014  3:16:09 PM      4:06 PM Disussed pt with Dr Blinda Leatherwood  MDM   Final diagnoses:  Hypernatremia  Hypoglycemia  Dehydration  Dementia    Pt with advanced dementia with decreased PO intake x 3 days.  It is unclear from the notes and from nurse's discussion with the facility what her baseline is, though the only full physical exam I see in EPIC is from last admission when she was admitted for encephalopathy (4/6-4/10) and her presentation today seems very similar.  She is clinically very dry and is found to be hypernatremic and hypoglycemic.  Pt switched from NS to D5-1/2NS and repeat cbg was even lower at 58 - pt then given D50, 1 amp.  Workup otherwise unremarkable.  Per records patient is a full code.  There has been no family bedside throughout ED visit.  Pt left on contact precautions as she has been receiving treatment for c.diff.   D/C summary indicates she should be finished with antibiotics at this point but MAR appears to note continued treatment.   Admitted to [facility] for further evaluation and treatment.      Trixie Dredge, PA-C 03/04/14 1805

## 2014-03-05 DIAGNOSIS — E860 Dehydration: Secondary | ICD-10-CM | POA: Diagnosis present

## 2014-03-05 DIAGNOSIS — E162 Hypoglycemia, unspecified: Secondary | ICD-10-CM | POA: Diagnosis present

## 2014-03-05 DIAGNOSIS — E870 Hyperosmolality and hypernatremia: Secondary | ICD-10-CM | POA: Diagnosis present

## 2014-03-05 DIAGNOSIS — E876 Hypokalemia: Secondary | ICD-10-CM

## 2014-03-05 LAB — BASIC METABOLIC PANEL
BUN: 9 mg/dL (ref 6–23)
CHLORIDE: 105 meq/L (ref 96–112)
CO2: 22 mEq/L (ref 19–32)
Calcium: 8.4 mg/dL (ref 8.4–10.5)
Creatinine, Ser: 0.83 mg/dL (ref 0.50–1.10)
GFR calc non Af Amer: 68 mL/min — ABNORMAL LOW (ref >90)
GFR, EST AFRICAN AMERICAN: 79 mL/min — AB (ref >90)
Glucose, Bld: 103 mg/dL — ABNORMAL HIGH (ref 70–99)
POTASSIUM: 3 meq/L — AB (ref 3.7–5.3)
Sodium: 141 mEq/L (ref 137–147)

## 2014-03-05 LAB — GLUCOSE, CAPILLARY
GLUCOSE-CAPILLARY: 86 mg/dL (ref 70–99)
GLUCOSE-CAPILLARY: 95 mg/dL (ref 70–99)
Glucose-Capillary: 166 mg/dL — ABNORMAL HIGH (ref 70–99)
Glucose-Capillary: 94 mg/dL (ref 70–99)
Glucose-Capillary: 96 mg/dL (ref 70–99)

## 2014-03-05 LAB — APTT: aPTT: 31 seconds (ref 24–37)

## 2014-03-05 LAB — CLOSTRIDIUM DIFFICILE BY PCR: Toxigenic C. Difficile by PCR: POSITIVE — AB

## 2014-03-05 LAB — COMPREHENSIVE METABOLIC PANEL
ALK PHOS: 68 U/L (ref 39–117)
ALT: 21 U/L (ref 0–35)
AST: 38 U/L — ABNORMAL HIGH (ref 0–37)
Albumin: 2.7 g/dL — ABNORMAL LOW (ref 3.5–5.2)
BUN: 11 mg/dL (ref 6–23)
CO2: 22 mEq/L (ref 19–32)
Calcium: 8.2 mg/dL — ABNORMAL LOW (ref 8.4–10.5)
Chloride: 106 mEq/L (ref 96–112)
Creatinine, Ser: 0.83 mg/dL (ref 0.50–1.10)
GFR calc non Af Amer: 68 mL/min — ABNORMAL LOW (ref >90)
GFR, EST AFRICAN AMERICAN: 79 mL/min — AB (ref >90)
GLUCOSE: 101 mg/dL — AB (ref 70–99)
POTASSIUM: 2.6 meq/L — AB (ref 3.7–5.3)
Sodium: 141 mEq/L (ref 137–147)
Total Bilirubin: 0.6 mg/dL (ref 0.3–1.2)
Total Protein: 5.8 g/dL — ABNORMAL LOW (ref 6.0–8.3)

## 2014-03-05 LAB — CBC
HEMATOCRIT: 33.9 % — AB (ref 36.0–46.0)
Hemoglobin: 11.6 g/dL — ABNORMAL LOW (ref 12.0–15.0)
MCH: 32.4 pg (ref 26.0–34.0)
MCHC: 34.2 g/dL (ref 30.0–36.0)
MCV: 94.7 fL (ref 78.0–100.0)
PLATELETS: 192 10*3/uL (ref 150–400)
RBC: 3.58 MIL/uL — AB (ref 3.87–5.11)
RDW: 13 % (ref 11.5–15.5)
WBC: 5.8 10*3/uL (ref 4.0–10.5)

## 2014-03-05 LAB — PROTIME-INR
INR: 1.23 (ref 0.00–1.49)
Prothrombin Time: 15.2 seconds (ref 11.6–15.2)

## 2014-03-05 LAB — TSH: TSH: 5.11 u[IU]/mL — AB (ref 0.350–4.500)

## 2014-03-05 MED ORDER — POTASSIUM CHLORIDE CRYS ER 20 MEQ PO TBCR
40.0000 meq | EXTENDED_RELEASE_TABLET | ORAL | Status: DC
  Administered 2014-03-05: 40 meq via ORAL
  Filled 2014-03-05 (×2): qty 2

## 2014-03-05 MED ORDER — FAMOTIDINE 10 MG PO TABS
10.0000 mg | ORAL_TABLET | Freq: Two times a day (BID) | ORAL | Status: DC
  Administered 2014-03-05 – 2014-03-07 (×3): 10 mg via ORAL
  Filled 2014-03-05 (×6): qty 1

## 2014-03-05 MED ORDER — POTASSIUM CHLORIDE 10 MEQ/100ML IV SOLN
10.0000 meq | INTRAVENOUS | Status: AC
  Administered 2014-03-05 (×4): 10 meq via INTRAVENOUS
  Filled 2014-03-05 (×4): qty 100

## 2014-03-05 MED ORDER — KCL IN DEXTROSE-NACL 40-5-0.9 MEQ/L-%-% IV SOLN
INTRAVENOUS | Status: DC
  Administered 2014-03-05 – 2014-03-06 (×2): via INTRAVENOUS
  Filled 2014-03-05 (×2): qty 1000

## 2014-03-05 MED ORDER — POTASSIUM CHLORIDE IN NACL 40-0.9 MEQ/L-% IV SOLN
INTRAVENOUS | Status: DC
Start: 2014-03-05 — End: 2014-03-05
  Filled 2014-03-05: qty 1000

## 2014-03-05 MED ORDER — POTASSIUM CHLORIDE CRYS ER 20 MEQ PO TBCR
40.0000 meq | EXTENDED_RELEASE_TABLET | Freq: Every day | ORAL | Status: DC
Start: 2014-03-06 — End: 2014-03-07
  Administered 2014-03-06 – 2014-03-07 (×2): 40 meq via ORAL
  Filled 2014-03-05 (×2): qty 2

## 2014-03-05 NOTE — Progress Notes (Signed)
Dr. Kevan NyGates is her is PCP.

## 2014-03-05 NOTE — Progress Notes (Signed)
Educated and informed husband about enteric precautions. Instructed him to wear gown and gloves.  He refused to wear the gown and gloves.  Instructed husband to wash his hands when entering and leaving the patient's room.  He agreed to wash his hands.

## 2014-03-05 NOTE — Progress Notes (Signed)
meds not given. Pt unable to swallow pills.

## 2014-03-05 NOTE — Progress Notes (Signed)
Brief Pharmacy Note:  Clarification of oral vancomycin taper for C.diff colitis:  Per [facility] MAR, plan is as follows:  Vancomycin 125mg  PO QID started 5/3 x 14 days will finish on 05/17 at 8am, then patient will continue with 125mg  PO BID x 14 days, then 125mg  PO once daily x 14 days.  Loralee PacasErin Grettel Rames, PharmD, BCPS Pager: [phone] 03/05/2014 10:11 AM

## 2014-03-05 NOTE — Progress Notes (Signed)
PROGRESS NOTE    Ebony [name] NGE:[id number] DOB: 1941-01-10 DOA: 03/04/2014 PCP: Geryl RankinsGREGANTI,MAC ANDREW, MD  HPI/Brief narrative 73 year old female, [facility] for the last 14 weeks, with history of advanced Alzheimer's dementia, GERD, depression, seizures, HTN, HLD, recently diagnosed C. difficile colitis on slow vancomycin taper, presented to the ED on 03/04/14 with report of dehydration and weakness. She was receiving IV fluids at SNF but lost her IV access and they were unable to find another one. In ED, vitals are stable, BP 111/89, HR 65, T max 98.4 F and oxygen saturation 100% on room air. Blood work revealed sodium of 151, chloride 114 and potassium 3.3. She was started on IV fluids for hydration. CT head and chest x-ray without acute findings. UA negative.   Assessment/Plan: 1. Hypernatremic dehydration: Secondary to ongoing diarrhea and poor oral intake. Hydrated with IV hypotonic fluids and hypernatremia has resolved. Will change IV fluids to normal saline and continue IV hydration for additional 24 hours. Encourage oral intake. We'll get speech therapy consultation for dysphagia evaluation (patient takes medications crushed in applesauce). 2. Hypokalemia: Secondary to diarrhea. Replace and follow BMP. 3. C. difficile colitis: Patient still has diarrhea. Continue slow vancomycin taper. DC PPI (no history of GI bleed as per discussion with spouse). Continue Florastor. 4. GERD: DC PPI due to C. difficile. Pepcid added. 5. History of advanced Alzheimer's dementia: Mental status probably at baseline. Continue Namenda and Aricept. 6. History of depression: Continue Seroquel.  7. History of seizures: Continue Lamictal 8. Dysphagia: Speech therapy consultation. 9. Anemia: Follow CBCs 10. Hypoglycemia: Seen yesterday. Monitor CBGs closely. If has recurrent hypoglycemia despite by mouth intake, we'll change IV fluids back to D5 containing.    Code Status: Full  Family  Communication: Discussed with spouse on 5/16. Disposition Plan: Return to [facility] when medically stable, possibly next 48 hours.   Consultants:  None  Procedures:  None  Antibiotics:  Oral vancomycin taper   Subjective: Patient nonverbal. As per nursing, patient confused, lethargic, diarrhea x2 since this morning but no other acute events.  Objective: Filed Vitals:   03/04/14 1800 03/04/14 1900 03/04/14 2125 03/05/14 0558  BP:  115/65 116/75 115/77  Pulse: 72 67 75 68  Temp:  97.8 F (36.6 C) 97.4 F (36.3 C) 98 F (36.7 C)  TempSrc:  Oral Oral Oral  Resp: 16 16 16 18   Height:  5\' 4"  (1.626 m)    Weight:  50.4 kg (111 lb 1.8 oz)  50.3 kg (110 lb 14.3 oz)  SpO2: 100% 100% 93% 100%    Intake/Output Summary (Last 24 hours) at 03/05/14 1013 Last data filed at 03/04/14 1538  Gross per 24 hour  Intake      0 ml  Output    300 ml  Net   -300 ml   Filed Weights   03/04/14 1900 03/05/14 0558  Weight: 50.4 kg (111 lb 1.8 oz) 50.3 kg (110 lb 14.3 oz)     Exam:  General exam: Moderately built, frail and cachectic, chronically ill-looking female patient lying comfortably curled up in bed. Respiratory system: Poor inspiratory effort but seems clear to auscultation. No increased work of breathing. Cardiovascular system: S1 & S2 heard, RRR. No JVD, murmurs, gallops, clicks or pedal edema. Telemetry: Sinus rhythm Gastrointestinal system: Abdomen is nondistended, soft and nontender. Normal bowel sounds heard. Central nervous system: Somnolent but easily arousable, mumbles incomprehensibly and goes back to sleep. Does not follow instructions. Not oriented. No focal  neurological deficits. Extremities: Symmetric 5 x 5 power. A few old superficial bruises/ecchymosis patches over her right distal forearm/hand.   Data Reviewed: Basic Metabolic Panel:  Recent Labs Lab 03/04/14 1505 03/04/14 2000 03/05/14 0805  NA 151* 148* 141  K 3.3* 3.9 2.6*  CL 114* 111 106  CO2  22 23 22   GLUCOSE 67* 107* 101*  BUN 20 17 11   CREATININE 1.10 0.98 0.83  CALCIUM 9.1 9.2 8.2*  MG  --  2.0  --   PHOS  --  2.0*  --    Liver Function Tests:  Recent Labs Lab 03/04/14 1505 03/04/14 2000 03/05/14 0805  AST 40* 51* 38*  ALT 23 24 21   ALKPHOS 83 78 68  BILITOT 0.7 0.6 0.6  PROT 6.8 6.9 5.8*  ALBUMIN 3.2* 3.1* 2.7*   No results found for this basename: LIPASE, AMYLASE,  in the last 168 hours No results found for this basename: AMMONIA,  in the last 168 hours CBC:  Recent Labs Lab 03/04/14 1505 03/04/14 2000 03/05/14 0805  WBC 5.8 5.8 5.8  NEUTROABS  --  2.8  --   HGB 13.0 12.7 11.6*  HCT 41.0 39.6 33.9*  MCV 100.0 99.0 94.7  PLT 211 186 192   Cardiac Enzymes: No results found for this basename: CKTOTAL, CKMB, CKMBINDEX, TROPONINI,  in the last 168 hours BNP (last 3 results) No results found for this basename: PROBNP,  in the last 8760 hours CBG:  Recent Labs Lab 03/04/14 1742 03/04/14 1828 03/05/14 0746  GLUCAP 58* 166* 86    Recent Results (from the past 240 hour(s))  MRSA PCR SCREENING     Status: None   Collection Time    03/04/14  7:08 PM      Result Value Ref Range Status   MRSA by PCR NEGATIVE  NEGATIVE Final   Comment:            The GeneXpert MRSA Assay (FDA     approved for NASAL specimens     only), is one component of a     comprehensive MRSA colonization     surveillance program. It is not     intended to diagnose MRSA     infection nor to guide or     monitor treatment for     MRSA infections.        Studies: Dg Chest 2 View  03/04/2014   CLINICAL DATA:  Dehydration and weakness.  EXAM: CHEST - 2 VIEW  COMPARISON:  DG CHEST 1 VIEW dated 01/24/2014  FINDINGS: The heart size and mediastinal contours are within normal limits. There is no evidence of pulmonary edema, consolidation, pneumothorax, nodule or pleural fluid. The visualized skeletal structures are unremarkable.  IMPRESSION: No active disease.   Electronically  Signed   By: Irish LackGlenn  Yamagata M.D.   On: 03/04/2014 17:11   Ct Head Wo Contrast  03/04/2014   CLINICAL DATA:  Altered mental status, weakness  EXAM: CT HEAD WITHOUT CONTRAST  TECHNIQUE: Contiguous axial images were obtained from the base of the skull through the vertex without intravenous contrast.  COMPARISON:  CT HEAD W/O CM dated 01/24/2014; CT HEAD W/O CM dated 10/11/2010; CT HEAD W/O CM dated 09/11/2009  FINDINGS: Patient motion artifact limits evaluation. No evidence for acute cortically based infarct, mass lesion or intra axial or extra-axial hemorrhage. Re- demonstrated prominence of the ventricular system and sulci compatible with volume loss.  Re- demonstrated diffuse periventricular and subcortical white matter hypodensity compatible with  chronic small vessel ischemic change. 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 Orbits are grossly unremarkable. Paranasal sinuses are well aerated. Calvarium is intact.  IMPRESSION: Limited due to motion artifact.  No acute intracranial process.  Moderate cortical volume loss.   Electronically Signed   By: Annia Belt M.D.   On: 03/04/2014 17:39        Scheduled Meds: . calcium-vitamin D  1 tablet Oral Q breakfast  . citalopram  10 mg Oral Daily  . donepezil  5 mg Oral 3 times per day  . fluticasone  1 spray Each Nare QHS  . gabapentin  600 mg Oral BID  . lamoTRIgine  25 mg Oral BID  . loratadine  10 mg Oral Daily  . memantine  10 mg Oral BID  . pantoprazole  40 mg Oral Daily  . potassium chloride SA  40 mEq Oral Q4H  . [START ON 03/06/2014] potassium chloride  40 mEq Oral Daily  . QUEtiapine  25 mg Oral QHS  . saccharomyces boulardii  250 mg Oral BID  . sodium chloride  3 mL Intravenous Q12H  . vancomycin  125 mg Oral QID  . [START ON 03/06/2014] vancomycin  125 mg Oral Q12H  . [START ON 03/21/2014] vancomycin  125 mg Oral Daily  . [START ON 03/15/2014] Vitamin D (Ergocalciferol)  50,000 Units Oral Q14 Days    Continuous Infusions: . 0.9 % NaCl with KCl 40 mEq / L    . dextrose 5 % and 0.45% NaCl 75 mL/hr at 03/05/14 1610    Principal Problem:   Dehydration Active Problems:   ALZHEIMERS DISEASE   GERD   DEPRESSION   Seizures   Hypokalemia   Enteritis due to Clostridium difficile   Hypernatremia    Time spent: 45 minutes    Elease Etienne, MD, FACP, [facility]. [facility] Pager [phone]  If 7PM-7AM, please contact night-coverage [email or web address] Password [email or web address] 03/05/2014, 10:13 AM    LOS: 1 day

## 2014-03-05 NOTE — Progress Notes (Signed)
CRITICAL VALUE ALERT  Critical value received:  Positive C-diff  Date of notification:  03/05/14  Time of notification:  1200   Critical value read back:yes  Nurse who received alert:  Mignon PineJenny Burns, RN  MD notified (1st page):  [facility]  Time of first page:  1205    MD notified (2nd page):  Time of second page:  Responding MD:  Waymon AmatoHongalgi  Time MD responded:  1206

## 2014-03-05 NOTE — Progress Notes (Signed)
Patient husband says that patient takes pills with applesauce at [facility] and she does fine. She will not take them whole.

## 2014-03-06 DIAGNOSIS — E872 Acidosis, unspecified: Secondary | ICD-10-CM

## 2014-03-06 DIAGNOSIS — A0472 Enterocolitis due to Clostridium difficile, not specified as recurrent: Secondary | ICD-10-CM | POA: Diagnosis present

## 2014-03-06 LAB — BASIC METABOLIC PANEL
BUN: 7 mg/dL (ref 6–23)
CHLORIDE: 110 meq/L (ref 96–112)
CO2: 17 mEq/L — ABNORMAL LOW (ref 19–32)
CREATININE: 0.82 mg/dL (ref 0.50–1.10)
Calcium: 8.6 mg/dL (ref 8.4–10.5)
GFR calc non Af Amer: 69 mL/min — ABNORMAL LOW (ref >90)
GFR, EST AFRICAN AMERICAN: 80 mL/min — AB (ref >90)
Glucose, Bld: 93 mg/dL (ref 70–99)
POTASSIUM: 4 meq/L (ref 3.7–5.3)
Sodium: 141 mEq/L (ref 137–147)

## 2014-03-06 LAB — GLUCOSE, CAPILLARY
GLUCOSE-CAPILLARY: 79 mg/dL (ref 70–99)
Glucose-Capillary: 71 mg/dL (ref 70–99)
Glucose-Capillary: 71 mg/dL (ref 70–99)
Glucose-Capillary: 82 mg/dL (ref 70–99)
Glucose-Capillary: 91 mg/dL (ref 70–99)
Glucose-Capillary: 94 mg/dL (ref 70–99)

## 2014-03-06 LAB — CBC
HEMATOCRIT: 36.1 % (ref 36.0–46.0)
HEMOGLOBIN: 12.2 g/dL (ref 12.0–15.0)
MCH: 31.4 pg (ref 26.0–34.0)
MCHC: 33.8 g/dL (ref 30.0–36.0)
MCV: 93 fL (ref 78.0–100.0)
Platelets: 210 10*3/uL (ref 150–400)
RBC: 3.88 MIL/uL (ref 3.87–5.11)
RDW: 13.3 % (ref 11.5–15.5)
WBC: 5.8 10*3/uL (ref 4.0–10.5)

## 2014-03-06 MED ORDER — BIOTENE DRY MOUTH MT LIQD
15.0000 mL | Freq: Two times a day (BID) | OROMUCOSAL | Status: DC
  Administered 2014-03-06: 15 mL via OROMUCOSAL

## 2014-03-06 MED ORDER — SODIUM BICARBONATE 8.4 % IV SOLN
INTRAVENOUS | Status: AC
  Administered 2014-03-06 (×2): via INTRAVENOUS
  Filled 2014-03-06 (×3): qty 100

## 2014-03-06 MED ORDER — DONEPEZIL HCL 10 MG PO TABS
10.0000 mg | ORAL_TABLET | ORAL | Status: DC
  Administered 2014-03-07: 10 mg via ORAL
  Filled 2014-03-06 (×2): qty 1

## 2014-03-06 NOTE — Evaluation (Addendum)
Clinical/Bedside Swallow Evaluation Patient Details  Name: Ebony [name] MRN: [id number] Date of Birth: [birth year]  Today's Date: 03/06/2014 Time: 1600-1630 SLP Time Calculation (min): 30 min  Past Medical History:  Past Medical History  Diagnosis Date  . GERD (gastroesophageal reflux disease)   . Hypertension   . Chest pain, unspecified   . Alzheimer's dementia   . Chronic arthritis   . Postmenopausal   . Seizures   . Depression   . Hyperlipidemia   . Osteoporosis    Past Surgical History:  Past Surgical History  Procedure Laterality Date  . Left hip replacement      following fx  . Appendectomy     HPI:  73 year old female from nursing home with past medical history of alzheimer's dementia, enteritis due to C.diff (on PO vanco until 03/06/2014), depression and seizure disorder who [facility] from NH with reports of dehydration and weakness.  Per NH staff,  patient has been receiving IV fluids for dehydration but have lost IV access and had to transfer her to hospital for further care.   BSE ordered due to nursing reporting patient with difficulty swallowing pills. BSE completed 01/24/14 with diet recommendations of dysphagia 3/thin liquids.   Assessment / Plan / Recommendation Clinical Impression  BSE completed but limited due patient declining trials of solids.   No outward clinical s/s of aspiration noted with thin and nectar thick liquids by cup/straw and puree consistency.  Review of Nursing MARS from SNF indicates patient on puree diet and nectar thick liquids.   Recommend to proceed with dysphagia 1 (puree) and nectar thick liquids.  Recommend total assist with all meals due to cognitive deficits.   ST to continue in acute care setting for diet tolerance and possible advancement.  ST to contact SNF concerning modified diet as patient presented well during evaluation with thin liquids.      Aspiration Risk  Mild    Diet Recommendation Dysphagia 1  (Puree);Nectar-thick liquid   Liquid Administration via: Cup Medication Administration: Crushed with puree Supervision: Full supervision/cueing for compensatory strategies Compensations: Small sips/bites;Slow rate Postural Changes and/or Swallow Maneuvers: Out of bed for meals;Seated upright 90 degrees;Upright 30-60 min after meal    Other  Recommendations Oral Care Recommendations: Oral care BID Other Recommendations: Order thickener from pharmacy;Prohibited food (jello, ice cream, thin soups);Remove water pitcher;Clarify dietary restrictions   Follow Up Recommendations  Skilled Nursing facility    Frequency and Duration min 2x/week  2 weeks       SLP Swallow Goals Refer to Care Plans for listed goals   Swallow Study Prior Functional Status   Resident of SNF  Puree consistency and nectar thick liquids     General Date of Onset: 03/04/14 HPI: 73 year old female from nursing home with past medical history of alzheimer's dementia, enteritis due to C.diff (on PO vanco until 03/06/2014), depression and seizure disorder who [facility]04[facility] from NH with reports of dehydration and weakness. Patient is not a good historian due to history of dementia and family is not at the bedside to provide details of medical history. Per NH staff, apparently patient has been receiving IV fluids for dehydration but have lost IV access and had to transfer her to hospital for further care. Type of Study: Bedside swallow evaluation Diet Prior to this Study: NPO Respiratory Status: Room air Behavior/Cognition: Alert;Cooperative;Confused;Requires cueing;Distractible;Decreased sustained attention Oral Cavity - Dentition: Adequate natural dentition Self-Feeding Abilities: Total assist Patient Positioning:  Upright in chair Baseline Vocal Quality: Clear Volitional Cough: Cognitively unable to elicit Volitional Swallow: Unable to elicit    Oral/Motor/Sensory Function Overall Oral Motor/Sensory  Function: Appears within functional limits for tasks assessed   Ice Chips Ice chips: Not tested   Thin Liquid Thin Liquid: Within functional limits Presentation: Cup;Straw    Nectar Thick Nectar Thick Liquid: Within functional limits Presentation: Cup   Honey Thick Honey Thick Liquid: Not tested   Puree Puree: Impaired Oral Phase Impairments: Poor awareness of bolus Oral Phase Functional Implications: Oral holding   Solid   GO    Solid: Not tested      Ebony FowlerKaren Ezelle Surprenant MS, CCC-SLP [phone] Ebony LabellaKaren H Paddy [name] 03/06/2014,5:23 PM

## 2014-03-06 NOTE — Progress Notes (Signed)
INITIAL NUTRITION ASSESSMENT  Patient meets the criteria for severe MALNUTRITION in the context of chronic illness with 11% weight loss in 1 months and PO intake <75% of estimated needs.   DOCUMENTATION CODES Per approved criteria  -Severe malnutrition in the context of chronic illness   INTERVENTION: - Diet advancement per SLP - When diet advanced, will add Magic Cups to trays BID, each provides 290 kcal, 9 g protein  NUTRITION DIAGNOSIS: Inadequate oral intake related to inability to eat as evidenced by NPO status  Goal: - Patient will meet >/=90% of estimated nutrition needs  Monitor:  Diet advancement, PO intake, weight, labs  Reason for Assessment: Malnutrition screening tool  73 y.o. female  Admitting Dx: Dehydration with hypernatremia  ASSESSMENT: 73 year old female patient with history of Alzheimer's dementia, enteritis due to C.Diff, presents with dehydration and weakness.   Patient is a skilled nursing resident who had been receiving IV fluids for dehydration, but lost IV access.   Unable to obtain history from patient due to confusion/Alzheimer's. No family present at this time. Patient is currently NPO. SLP consult noted. Patient was on a Dysphagia 3, thin liquids diet during last admission. Per chart review, patient receives Magic Cups BID at nursing facility. Will order when diet advanced. Patient has also lost about 20% of her weight in the last 3 years, with 11% in the last month since last admission.   Height: Ht Readings from Last 1 Encounters:  03/04/14 5\' 4"  (1.626 m)    Weight: Wt Readings from Last 1 Encounters:  03/06/14 111 lb 1.8 oz (50.4 kg)    Ideal Body Weight: 120 pounds  % Ideal Body Weight: 93%  Wt Readings from Last 10 Encounters:  03/06/14 111 lb 1.8 oz (50.4 kg)  01/24/14 125 lb (56.7 kg)  04/17/11 138 lb (62.596 kg)  09/27/10 137 lb (62.143 kg)  06/28/09 132 lb (59.875 kg)  09/07/08 126 lb (57.153 kg)  06/23/08 127 lb (57.607  kg)  12/24/07 129 lb (58.514 kg)  06/09/07 142 lb (64.411 kg)    Usual Body Weight: 125 pounds  % Usual Body Weight: 89%  BMI:  Body mass index is 19.06 kg/(m^2). Patient is normal weight.   Estimated Nutritional Needs: Kcal: 1300-1400 kcal Protein: 60-70g Fluid: >1.5 L/day  Skin: Intact  Diet Order: NPO  EDUCATION NEEDS: -No education needs identified at this time   Intake/Output Summary (Last 24 hours) at 03/06/14 1224 Last data filed at 03/05/14 1800  Gross per 24 hour  Intake 320.83 ml  Output      0 ml  Net 320.83 ml    Last BM: 5/17   Labs:   Recent Labs Lab 03/04/14 2000 03/05/14 0805 03/05/14 1614 03/06/14 0544  NA 148* 141 141 141  K 3.9 2.6* 3.0* 4.0  CL 111 106 105 110  CO2 23 22 22  17*  BUN 17 11 9 7   CREATININE 0.98 0.83 0.83 0.82  CALCIUM 9.2 8.2* 8.4 8.6  MG 2.0  --   --   --   PHOS 2.0*  --   --   --   GLUCOSE 107* 101* 103* 93    CBG (last 3)   Recent Labs  03/06/14 0038 03/06/14 0430 03/06/14 0740  GLUCAP 71 79 71    Scheduled Meds: . calcium-vitamin D  1 tablet Oral Q breakfast  . citalopram  10 mg Oral Daily  . donepezil  5 mg Oral 3 times per day  . famotidine  10 mg Oral BID  . fluticasone  1 spray Each Nare QHS  . gabapentin  600 mg Oral BID  . lamoTRIgine  25 mg Oral BID  . loratadine  10 mg Oral Daily  . memantine  10 mg Oral BID  . potassium chloride  40 mEq Oral Daily  . QUEtiapine  25 mg Oral QHS  . saccharomyces boulardii  250 mg Oral BID  . sodium chloride  3 mL Intravenous Q12H  . vancomycin  125 mg Oral QID  . vancomycin  125 mg Oral Q12H  . [START ON 03/21/2014] vancomycin  125 mg Oral Daily  . [START ON 03/15/2014] Vitamin D (Ergocalciferol)  50,000 Units Oral Q14 Days    Continuous Infusions: .  sodium bicarbonate  infusion 1000 mL 50 mL/hr at 03/06/14 [id number]    Past Medical History  Diagnosis Date  . GERD (gastroesophageal reflux disease)   . Hypertension   . Chest pain, unspecified   .  Alzheimer's dementia   . Chronic arthritis   . Postmenopausal   . Seizures   . Depression   . Hyperlipidemia   . Osteoporosis     Past Surgical History  Procedure Laterality Date  . Left hip replacement      following fx  . Appendectomy      Linnell FullingElyse Rosio Weiss, RD, LDN Pager #: [phone] After-Hours Pager #: [phone]

## 2014-03-06 NOTE — Progress Notes (Signed)
PROGRESS NOTE    Ebony [name] UJW:[id number] DOB: 29-Aug-1941 DOA: 03/04/2014 PCP: Geryl RankinsGREGANTI,MAC ANDREW, MD  HPI/Brief narrative 73 year old female, [facility] for the last 14 weeks, with history of advanced Alzheimer's dementia, GERD, depression, seizures, HTN, HLD, recently diagnosed C. difficile colitis on slow vancomycin taper, presented to the ED on 03/04/14 with report of dehydration and weakness. She was receiving IV fluids at SNF but lost her IV access and they were unable to find another one. In ED, vitals are stable, BP 111/89, HR 65, T max 98.4 F and oxygen saturation 100% on room air. Blood work revealed sodium of 151, chloride 114 and potassium 3.3. She was started on IV fluids for hydration. CT head and chest x-ray without acute findings. UA negative.   Assessment/Plan: 1. Hypernatremic dehydration: Secondary to ongoing diarrhea and poor oral intake. Hydrated with IV hypotonic fluids and hypernatremia has resolved. Ongoing poor oral intake. Awaiting speech therapy for swallow evaluation. Patient has mild non-anion gap metabolic acidosis-secondary to diarrhea-we'll change IV fluids to bicarbonate drip and follow BMP. Improving. 2. Non-anion gap metabolic acidosis: Anion gap 14. Secondary to diarrhea. Bicarbonate drip. Follow BMP in a.m. 3. Hypokalemia: Secondary to diarrhea. Replaced. Follow BMP closely secondary to ongoing diarrhea. 4. C. difficile colitis: Patient still has diarrhea. Continue slow vancomycin taper. DC PPI (no history of GI bleed as per discussion with spouse). Continue Florastor. Quantify daily diarrhea frequency-discussed with nursing. 5. GERD: DC PPI due to C. difficile. Pepcid added. 6. History of advanced Alzheimer's dementia: Mental status probably at baseline. Continue Namenda and Aricept. 7. History of depression: Continue Seroquel.  8. History of seizures: Continue Lamictal 9. Dysphagia: Speech therapy consultation-pending. 10. Anemia:  Stable 11. Hypoglycemia: Seen 5/16. Monitor CBGs closely. Continue to encourage by mouth intake and continue dextrose containing IV fluids.   Code Status: Full  Family Communication: Discussed with spouse on 5/16. Dis[facility] Plan[facility] when medically stable, possibly next 48 hours.   Consultants:  None  Procedures:  None  Antibiotics:  Oral vancomycin taper   Subjective: Incomprehensible speech. As per nursing, confused and attempts to get out of bed. No agitation. Had diarrhea x1 this morning (apparently all stools are not being documented)  Objective: Filed Vitals:   03/05/14 1352 03/05/14 2024 03/06/14 0245 03/06/14 0433  BP: 110/84 121/82  130/89  Pulse: 71 72  68  Temp: 98.1 F (36.7 C) 98 F (36.7 C)  97.5 F (36.4 C)  TempSrc: Oral Oral  Oral  Resp: 18 16  20   Height:      Weight:   50.4 kg (111 lb 1.8 oz)   SpO2: 98% 99%  99%    Intake/Output Summary (Last 24 hours) at 03/06/14 1117 Last data filed at 03/05/14 1800  Gross per 24 hour  Intake 1765.83 ml  Output      0 ml  Net 1765.83 ml   Filed Weights   03/04/14 1900 03/05/14 0558 03/06/14 0245  Weight: 50.4 kg (111 lb 1.8 oz) 50.3 kg (110 lb 14.3 oz) 50.4 kg (111 lb 1.8 oz)     Exam:  General exam: Moderately built, frail and cachectic, chronically ill-looking female patient lying comfortably supine in bed Respiratory system: Poor inspiratory effort but seems clear to auscultation. No increased work of breathing. Cardiovascular system: S1 & S2 heard, RRR. No JVD, murmurs, gallops, clicks or pedal edema. Telemetry: Sinus rhythm Gastrointestinal system: Abdomen is nondistended, soft and nontender. Normal bowel sounds heard. Central  nervous system: Alert and mumbles incomprehensibly. Does not follow instructions. Not oriented. No focal neurological deficits. Extremities: Symmetric 5 x 5 power. A few old superficial bruises/ecchymosis patches over her right distal  forearm/hand.   Data Reviewed: Basic Metabolic Panel:  Recent Labs Lab 03/04/14 1505 03/04/14 2000 03/05/14 0805 03/05/14 1614 03/06/14 0544  NA 151* 148* 141 141 141  K 3.3* 3.9 2.6* 3.0* 4.0  CL 114* 111 106 105 110  CO2 22 23 22 22  17*  GLUCOSE 67* 107* 101* 103* 93  BUN 20 17 11 9 7   CREATININE 1.10 0.98 0.83 0.83 0.82  CALCIUM 9.1 9.2 8.2* 8.4 8.6  MG  --  2.0  --   --   --   PHOS  --  2.0*  --   --   --    Liver Function Tests:  Recent Labs Lab 03/04/14 1505 03/04/14 2000 03/05/14 0805  AST 40* 51* 38*  ALT 23 24 21   ALKPHOS 83 78 68  BILITOT 0.7 0.6 0.6  PROT 6.8 6.9 5.8*  ALBUMIN 3.2* 3.1* 2.7*   No results found for this basename: LIPASE, AMYLASE,  in the last 168 hours No results found for this basename: AMMONIA,  in the last 168 hours CBC:  Recent Labs Lab 03/04/14 1505 03/04/14 2000 03/05/14 0805 03/06/14 0544  WBC 5.8 5.8 5.8 5.8  NEUTROABS  --  2.8  --   --   HGB 13.0 12.7 11.6* 12.2  HCT 41.0 39.6 33.9* 36.1  MCV 100.0 99.0 94.7 93.0  PLT 211 186 192 210   Cardiac Enzymes: No results found for this basename: CKTOTAL, CKMB, CKMBINDEX, TROPONINI,  in the last 168 hours BNP (last 3 results) No results found for this basename: PROBNP,  in the last 8760 hours CBG:  Recent Labs Lab 03/05/14 1703 03/05/14 2021 03/06/14 0038 03/06/14 0430 03/06/14 0740  GLUCAP 96 95 71 79 71    Recent Results (from the past 240 hour(s))  MRSA PCR SCREENING     Status: None   Collection Time    03/04/14  7:08 PM      Result Value Ref Range Status   MRSA by PCR NEGATIVE  NEGATIVE Final   Comment:            The GeneXpert MRSA Assay (FDA     approved for NASAL specimens     only), is one component of a     comprehensive MRSA colonization     surveillance program. It is not     intended to diagnose MRSA     infection nor to guide or     monitor treatment for     MRSA infections.  CLOSTRIDIUM DIFFICILE BY PCR     Status: Abnormal   Collection  Time    03/05/14  3:24 AM      Result Value Ref Range Status   C difficile by pcr POSITIVE (*) NEGATIVE Final   Comment: CRITICAL RESULT CALLED TO, READ BACK BY AND VERIFIED WITH:     J.BURNS,RN 1140 03/05/14 EHOWARD     Performed at [facility]        Studies: Dg Chest 2 View  03/04/2014   CLINICAL DATA:  Dehydration and weakness.  EXAM: CHEST - 2 VIEW  COMPARISON:  DG CHEST 1 VIEW dated 01/24/2014  FINDINGS: The heart size and mediastinal contours are within normal limits. There is no evidence of pulmonary edema, consolidation, pneumothorax, nodule or pleural fluid. The visualized skeletal  structures are unremarkable.  IMPRESSION: No active disease.   Electronically Signed   By: Irish Lack M.D.   On: 03/04/2014 17:11   Ct Head Wo Contrast  03/04/2014   CLINICAL DATA:  Altered mental status, weakness  EXAM: CT HEAD WITHOUT CONTRAST  TECHNIQUE: Contiguous axial images were obtained from the base of the skull through the vertex without intravenous contrast.  COMPARISON:  CT HEAD W/O CM dated 01/24/2014; CT HEAD W/O CM dated 10/11/2010; CT HEAD W/O CM dated 09/11/2009  FINDINGS: Patient motion artifact limits evaluation. No evidence for acute cortically based infarct, mass lesion or intra axial or extra-axial hemorrhage. Re- demonstrated prominence of the ventricular system and sulci compatible with volume loss.  Re- demonstrated diffuse periventricular and subcortical white matter hypodensity compatible with chronic small vessel ischemic change. 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 Orbits are grossly unremarkable. Paranasal sinuses are well aerated. Calvarium is intact.  IMPRESSION: Limited due to motion artifact.  No acute intracranial process.  Moderate cortical volume loss.   Electronically Signed   By: Annia Belt M.D.   On: 03/04/2014 17:39        Scheduled Meds: . calcium-vitamin D  1 tablet Oral Q breakfast  . citalopram  10 mg Oral Daily  .  donepezil  5 mg Oral 3 times per day  . famotidine  10 mg Oral BID  . fluticasone  1 spray Each Nare QHS  . gabapentin  600 mg Oral BID  . lamoTRIgine  25 mg Oral BID  . loratadine  10 mg Oral Daily  . memantine  10 mg Oral BID  . potassium chloride  40 mEq Oral Daily  . QUEtiapine  25 mg Oral QHS  . saccharomyces boulardii  250 mg Oral BID  . sodium chloride  3 mL Intravenous Q12H  . vancomycin  125 mg Oral QID  . vancomycin  125 mg Oral Q12H  . [START ON 03/21/2014] vancomycin  125 mg Oral Daily  . [START ON 03/15/2014] Vitamin D (Ergocalciferol)  50,000 Units Oral Q14 Days   Continuous Infusions: .  sodium bicarbonate  infusion 1000 mL 50 mL/hr at 03/06/14 1308    Principal Problem:   Dehydration with hypernatremia Active Problems:   ALZHEIMERS DISEASE   GERD   DEPRESSION   Seizures   Hypokalemia   Enteritis due to Clostridium difficile   Hypoglycemia   Dehydration    Time spent: 25 minutes    Elease Etienne, MD, FACP, [facility]. [facility] Pager [phone]  If 7PM-7AM, please contact night-coverage [email or web address] Password [email or web address] 03/06/2014, 11:17 AM    LOS: 2 days

## 2014-03-07 DIAGNOSIS — E43 Unspecified severe protein-calorie malnutrition: Secondary | ICD-10-CM | POA: Insufficient documentation

## 2014-03-07 LAB — GLUCOSE, CAPILLARY
GLUCOSE-CAPILLARY: 88 mg/dL (ref 70–99)
Glucose-Capillary: 93 mg/dL (ref 70–99)
Glucose-Capillary: 79 mg/dL (ref 70–99)
Glucose-Capillary: 105 mg/dL — ABNORMAL HIGH (ref 70–99)

## 2014-03-07 LAB — BASIC METABOLIC PANEL
BUN: 5 mg/dL — ABNORMAL LOW (ref 6–23)
CO2: 20 mEq/L (ref 19–32)
CREATININE: 0.88 mg/dL (ref 0.50–1.10)
Calcium: 8.8 mg/dL (ref 8.4–10.5)
Chloride: 105 mEq/L (ref 96–112)
GFR calc Af Amer: 73 mL/min — ABNORMAL LOW (ref >90)
GFR calc non Af Amer: 63 mL/min — ABNORMAL LOW (ref >90)
GLUCOSE: 86 mg/dL (ref 70–99)
POTASSIUM: 4.4 meq/L (ref 3.7–5.3)
Sodium: 140 mEq/L (ref 137–147)

## 2014-03-07 MED ORDER — VANCOMYCIN 50 MG/ML ORAL SOLUTION: 125.0000 mg | Freq: Two times a day (BID) | ORAL | Status: AC

## 2014-03-07 MED ORDER — FAMOTIDINE 10 MG PO TABS: 10.0000 mg | ORAL_TABLET | Freq: Two times a day (BID) | ORAL | Status: AC

## 2014-03-07 NOTE — Progress Notes (Addendum)
Clinical Social Work Department BRIEF PSYCHOSOCIAL ASSESSMENT 03/07/2014  Patient:  [name],Desaray S     Account Number:  [id number]     Admit date:  03/04/2014  Clinical Social Worker:  Jacelyn GripBYRD,SUZANNA, LCSWA  Date/Time:  03/07/2014 11:00 AM  Referred by:  Physician  Date Referred:  03/07/2014 Referred for  SNF Placement   Other Referral:   Interview type:  Family Other interview type:    PSYCHOSOCIAL DATA Living Status:  FACILITY Ad[facility] Level of care:  Skilled Nursing Facility Primary support name:  Dorinda HillDonald Manternach/husband/[phone] Primary support relationship to patient:  SPOUSE Degree of support available:   strong    CURRENT CONCERNS Current Concerns  Post-Acute Placement   Other Concerns:    SOCIAL WORK ASSESSMENT / PLAN CSW received referral that [facility] and medically ready for discharge today.    CSW reviewed chart and noted that pt oriented to person only. CSW visited pt room and no family present at bedside. CSW contacted pt husband via telephone. CSW introduced self and explained role. Pt husband confirmed that [facility] is a [facility] and agreeable to pt return. Pt husband expressed that he would like for CSW to ensure that pt room is deeply cleaned prior to [facility] due to pt reoccurrence of CDIFF. Pt husband reports that he requested room to be cleaned from facility, but would appreciate if CSW can ensure that it is done prior to pt discharge from hospital. CSW expressed understanding and will notify [facility].    CSW [facility] coordinator who confirmed that pt can return today. [facility] confirmed that they are in the process of cleaning pt room in order for pt room to be deeply clean prior to pt return.    CSW to facilitate pt discharge needs this afternoon.    CSW to continue to follow.    Assessment/plan status:  Psychosocial Support/Ongoing Assessment of Needs Other assessment/ plan:   discharge planning   Information/referral to community resources:   Referral back to [facility].    PATIENT'S/FAMILY'S RESPONSE TO PLAN OF CARE: Pt alert and oriented to person only. Pt husband supportive and actively involved in pt care. Pt husband eager for [facility] to be cleaned prior to pt return.     Loletta SpecterSuzanna Kidd, MSW, LCSW Clinical Social Work Coverage for [employer], [place] [phone]

## 2014-03-07 NOTE — Progress Notes (Signed)
Pt for [facility].   CSW facilitated pt discharge needs including contacting facility, faxing pt discharge information via [facility], discussing with pt husband via telephone, providing RN phone number to call report, and arranging ambulance transport for pt [facility]. (Service Request ID#: [id number]).  No further social work needs identified at this time.  CSW signing off.   Loletta SpecterSuzanna Akim Watkinson, MSW, LCSW Clinical Social Work Coverage for [employer], [place] [phone]

## 2014-03-07 NOTE — Progress Notes (Signed)
Speech Language Pathology Treatment: Dysphagia  Patient Details Name: Ebony [name] MRN: [id number] DOB: [birth year] Today's Date: 03/07/2014 Time: 1341-1411 SLP Time Calculation (min): 30 min  Assessment / Plan / Recommendation Clinical Impression  Nursing reports poor po intake, with patient only taking bites, then refusing and closing lips tightly.  Patient was able to take sips of water via straw with SLP.  Other than holding the bolus orally for a few seconds, patient appeared to swallow without difficulty.  Chart was reviewed.  Last evaluation done in hospital was 01/24/14.  No aspiration was suspected and Dysphagia 3 with thin liquids were allowed.  Perhaps po intake would improve if diet recommendations were liberalized. CXR reveals: No active disease; Pt is afebrile; no overt s/s of aspiration observed with thin liquids with either treating SLP during this admit.  Recommend advancing to Dysphagia 3 with chopped meats and thin liquids, with f/u ST at SNF.   HPI HPI: 73 year old female from nursing home with past medical history of alzheimer's dementia, enteritis due to C.diff (on PO vanco until 03/06/2014), depression and seizure disorder who [facility]04/2014 from NH with reports of dehydration and weakness. Patient is not a good historian due to history of dementia and family is not at the bedside to provide details of medical history. Per NH staff, apparently patient has been receiving IV fluids for dehydration but have lost IV access and had to transfer her to hospital for further care.   Pertinent Vitals Afebrile; CXR: NAD  SLP Plan  Discharge SLP treatment due to (comment) (d/c to snf)    Recommendations Diet recommendations: Dysphagia 1 (puree);Thin liquid Liquids provided via: Straw;Cup Medication Administration: Crushed with puree Supervision: Full supervision/cueing for compensatory strategies Compensations: Small sips/bites;Slow rate Postural Changes and/or Swallow  Maneuvers: Out of bed for meals;Seated upright 90 degrees;Upright 30-60 min after meal              Oral Care Recommendations: Oral care BID;Staff/trained caregiver to provide oral care Follow up Recommendations: Skilled Nursing facility Plan: Discharge SLP treatment due to (comment) (d/c to snf)    GO     Lenor DerrickLori T Tonea Leiphart 03/07/2014, 2:12 PM

## 2014-03-07 NOTE — Discharge Summary (Signed)
Physician Discharge Summary  Arnoldo MoraleJoann S Laurent ZOX:[id number] DOB: [birth year] DOA: 03/04/2014  PCP: Geryl RankinsGREGANTI,MAC ANDREW, MD ? Neurologist: Dr. Merril AbbeHeidi Roth  Admit date: 03/04/2014 Discharge date: 03/07/2014  Time spent: Greater than 30 minutes  Recommendations for Outpatient Follow-up:  1. M.D. at SNF in 2-3 days with repeat labs (BMP). 2. Consider palliative care consultation at SNF- for goals of care. 3. Recommend speech therapy followup at SNF regarding dietary modifications & management. 4. Monitor CBGs closely-every 4 hours and then change as needed. 5. Dysphagia 1 diet and nectar thickened liquids.  Discharge Diagnoses:  Principal Problem:   Dehydration with hypernatremia Active Problems:   ALZHEIMERS DISEASE   GERD   DEPRESSION   Seizures   Hypokalemia   Enteritis due to Clostridium difficile   Hypoglycemia   Dehydration   Metabolic acidosis, normal anion gap (NAG)   C. difficile diarrhea   Protein-calorie malnutrition, severe   Discharge Condition: Improved & Stable  Diet recommendation: Dysphagia 1 diet and nectar thickened liquids  Filed Weights   03/05/14 0558 03/06/14 0245 03/07/14 0501  Weight: 50.3 kg (110 lb 14.3 oz) 50.4 kg (111 lb 1.8 oz) 54.1 kg (119 lb 4.3 oz)    History of present illness:  73 year old female, [facility] for the last 14 weeks, with history of advanced Alzheimer's dementia, GERD, depression, seizures, HTN, HLD, recently diagnosed C. difficile colitis on slow vancomycin taper, presented to the ED on 03/04/14 with report of dehydration and weakness. She was receiving IV fluids at SNF but lost her IV access and they were unable to find another one. In ED, vitals are stable, BP 111/89, HR 65, T max 98.4 F and oxygen saturation 100% on room air. Blood work revealed sodium of 151, chloride 114 and potassium 3.3. She was started on IV fluids for hydration. CT head and chest x-ray without acute findings. UA negative.   Hospital Course:    1. Hypernatremic dehydration: Secondary to ongoing diarrhea and poor oral intake. Hydrated with IV hypotonic fluids and has resolved. Ongoing poor oral intake. She is at high risk for recurrent dehydration secondary to poor oral intake. 2. Non-anion gap metabolic acidosis: treated briefly with IV bicarbonate infusion. Bicarbonate has improved from 17>20. Again she is at risk for recurrent episodes of same secondary to diarrhea. Follow BMP closely as outpatient. 3. Hypokalemia: Secondary to diarrhea. Replaced. Follow BMP closely secondary to ongoing diarrhea. 4. C. difficile colitis-? Recurrent: Patient still has diarrhea. Continue slow vancomycin taper. DC'ed PPI (no history of GI bleed as per discussion with spouse). Continue Florastor.  5. GERD: DC'ed PPI due to C. difficile. Pepcid added. 6. History of advanced Alzheimer's dementia: Mental status probably at baseline. Continue Namenda and Aricept. As per discussion with spouse, patient is on specialized dose of Aricept from neurology/geriatric consultation with Dr. Merril AbbeHeidi Roth. 7. History of depression: Continue Seroquel.  8. History of seizures: Continue Lamictal 9. Dysphagia: Speech therapy consulted and recommended dysphagia 1 and nectar thickened liquids. Patient tolerated some breakfast and took her medications this morning. Speech therapy to continue to assess at SNF. 10. Anemia: Stable 11. Hypoglycemia: Seen 5/16. No further episodes. Monitor CBGs closely. 12. Failure to thrive: Secondary to advanced age, advanced dementia and several significant medical problems. Discussed extensively with spouse and advised that she is at high risk for recurrent decline from her diarrhea and ongoing poor oral intake. Advised palliative care consultation at SNF for goals of care. He verbalizes understanding.  Consultations:  None  Procedures:  None    Discharge Exam:  Complaints:  Pleasantly confused and nonverbal. According to nursing, no  acute events. She ate some of her breakfast and took her medications this morning. 4 bouts of diarrhea in the last 24 hours.  Filed Vitals:   03/06/14 0433 03/06/14 1323 03/06/14 2134 03/07/14 0501  BP: 130/89 124/95 127/78 96/64  Pulse: 68 78 67 76  Temp: 97.5 F (36.4 C) 97.5 F (36.4 C) 97.5 F (36.4 C) 98.5 F (36.9 C)  TempSrc: Oral Oral Oral Oral  Resp: 20 20 23 22   Height:      Weight:    54.1 kg (119 lb 4.3 oz)  SpO2: 99% 100% 100% 100%    General exam: Moderately built, frail and cachectic, chronically ill-looking female patient lying comfortably supine in bed  Respiratory system: Poor inspiratory effort but seems clear to auscultation. No increased work of breathing.  Cardiovascular system: S1 & S2 heard, RRR. No JVD, murmurs, gallops, clicks or pedal edema.  Gastrointestinal system: Abdomen is nondistended, soft and nontender. Normal bowel sounds heard.  Central nervous system: Alert and mumbles incomprehensibly. Does not follow instructions. Not oriented. No focal neurological deficits.  Extremities: Symmetric 5 x 5 power. A few old superficial bruises/ecchymosis patches over her right distal forearm/hand.   Discharge Instructions      Discharge Instructions   Call MD for:  severe uncontrolled pain    Complete by:  As directed      Call MD for:  temperature >100.4    Complete by:  As directed      Call MD for:    Complete by:  As directed   Worsening diarrhea.     Discharge instructions    Complete by:  As directed   DIET: Dysphagia 1 diet and nectar thickened liquids.     Increase activity slowly    Complete by:  As directed             Medication List    STOP taking these medications       omeprazole 20 MG capsule  Commonly known as:  PRILOSEC      TAKE these medications       acetaminophen 500 MG tablet  Commonly known as:  TYLENOL  Take 500 mg by mouth 3 (three) times daily.     CALCIUM 600 + D 600-200 MG-UNIT Tabs  Generic drug:  Calcium  Carb-Cholecalciferol  Take 1 tablet by mouth daily.     citalopram 10 MG tablet  Commonly known as:  CELEXA  Take 10 mg by mouth daily.     donepezil 10 MG tablet  Commonly known as:  ARICEPT  Take 10 mg by mouth daily with breakfast.     donepezil 5 MG tablet  Commonly known as:  ARICEPT  Take 5 mg by mouth 3 (three) times daily. 1200, 1600, 2000     famotidine 10 MG tablet  Commonly known as:  PEPCID  Take 1 tablet (10 mg total) by mouth 2 (two) times daily.     fexofenadine 180 MG tablet  Commonly known as:  ALLEGRA  Take 180 mg by mouth daily.     fluticasone 50 MCG/ACT nasal spray  Commonly known as:  FLONASE  Place 1 spray into both nostrils at bedtime.     gabapentin 600 MG tablet  Commonly known as:  NEURONTIN  Take 600 mg by mouth 2 (two) times daily.     lamoTRIgine 25 MG tablet  Commonly known as:  LAMICTAL  Take 25 mg by mouth 2 (two) times daily.     memantine 10 MG tablet  Commonly known as:  NAMENDA  Take 10 mg by mouth 2 (two) times daily.     potassium chloride SA 20 MEQ tablet  Commonly known as:  K-DUR,KLOR-CON  Take 20 mEq by mouth daily.     PRESCRIPTION MEDICATION  Take 1 each by mouth 2 (two) times daily. "magic cup" bid per MAR     QUEtiapine 25 MG tablet  Commonly known as:  SEROQUEL  Take 25 mg by mouth at bedtime.     saccharomyces boulardii 250 MG capsule  Commonly known as:  FLORASTOR  Take 250 mg by mouth 2 (two) times daily.     vancomycin 50 mg/mL oral solution  Commonly known as:  VANCOCIN  Take 2.5 mLs (125 mg total) by mouth 2 (two) times daily. Pt started another round on 05/17 at 2000 for 14 days twice a day, then another round on 06/01 for 14 day for once a day.     Vitamin D (Ergocalciferol) 50000 UNITS Caps capsule  Commonly known as:  DRISDOL  Take 50,000 Units by mouth as directed. Every 2 weeks          The results of significant diagnostics from this hospitalization (including imaging, microbiology,  ancillary and laboratory) are listed below for reference.    Significant Diagnostic Studies: Dg Chest 2 View  03/04/2014   CLINICAL DATA:  Dehydration and weakness.  EXAM: CHEST - 2 VIEW  COMPARISON:  DG CHEST 1 VIEW dated 01/24/2014  FINDINGS: The heart size and mediastinal contours are within normal limits. There is no evidence of pulmonary edema, consolidation, pneumothorax, nodule or pleural fluid. The visualized skeletal structures are unremarkable.  IMPRESSION: No active disease.   Electronically Signed   By: Irish Lack M.D.   On: 03/04/2014 17:11   Ct Head Wo Contrast  03/04/2014   CLINICAL DATA:  Altered mental status, weakness  EXAM: CT HEAD WITHOUT CONTRAST  TECHNIQUE: Contiguous axial images were obtained from the base of the skull through the vertex without intravenous contrast.  COMPARISON:  CT HEAD W/O CM dated 01/24/2014; CT HEAD W/O CM dated 10/11/2010; CT HEAD W/O CM dated 09/11/2009  FINDINGS: Patient motion artifact limits evaluation. No evidence for acute cortically based infarct, mass lesion or intra axial or extra-axial hemorrhage. Re- demonstrated prominence of the ventricular system and sulci compatible with volume loss.  Re- demonstrated diffuse periventricular and subcortical white matter hypodensity compatible with chronic small vessel ischemic change. 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 Orbits are grossly unremarkable. Paranasal sinuses are well aerated. Calvarium is intact.  IMPRESSION: Limited due to motion artifact.  No acute intracranial process.  Moderate cortical volume loss.   Electronically Signed   By: Annia Belt M.D.   On: 03/04/2014 17:39    Microbiology: Recent Results (from the past 240 hour(s))  MRSA PCR SCREENING     Status: None   Collection Time    03/04/14  7:08 PM      Result Value Ref Range Status   MRSA by PCR NEGATIVE  NEGATIVE Final   Comment:            The GeneXpert MRSA Assay (FDA     approved for NASAL  specimens     only), is one component of a     comprehensive MRSA colonization     surveillance program. It is not  intended to diagnose MRSA     infection nor to guide or     monitor treatment for     MRSA infections.  CLOSTRIDIUM DIFFICILE BY PCR     Status: Abnormal   Collection Time    03/05/14  3:24 AM      Result Value Ref Range Status   C difficile by pcr POSITIVE (*) NEGATIVE Final   Comment: CRITICAL RESULT CALLED TO, READ BACK BY AND VERIFIED WITH:     J.BURNS,RN 1140 03/05/14 EHOWARD     Performed at [facility]     Labs: Basic Metabolic Panel:  Recent Labs Lab 03/04/14 2000 03/05/14 0805 03/05/14 1614 03/06/14 0544 03/07/14 0359  NA 148* 141 141 141 140  K 3.9 2.6* 3.0* 4.0 4.4  CL 111 106 105 110 105  CO2 23 22 22  17* 20  GLUCOSE 107* 101* 103* 93 86  BUN 17 11 9 7  5*  CREATININE 0.98 0.83 0.83 0.82 0.88  CALCIUM 9.2 8.2* 8.4 8.6 8.8  MG 2.0  --   --   --   --   PHOS 2.0*  --   --   --   --    Liver Function Tests:  Recent Labs Lab 03/04/14 1505 03/04/14 2000 03/05/14 0805  AST 40* 51* 38*  ALT 23 24 21   ALKPHOS 83 78 68  BILITOT 0.7 0.6 0.6  PROT 6.8 6.9 5.8*  ALBUMIN 3.2* 3.1* 2.7*   No results found for this basename: LIPASE, AMYLASE,  in the last 168 hours No results found for this basename: AMMONIA,  in the last 168 hours CBC:  Recent Labs Lab 03/04/14 1505 03/04/14 2000 03/05/14 0805 03/06/14 0544  WBC 5.8 5.8 5.8 5.8  NEUTROABS  --  2.8  --   --   HGB 13.0 12.7 11.6* 12.2  HCT 41.0 39.6 33.9* 36.1  MCV 100.0 99.0 94.7 93.0  PLT 211 186 192 210   Cardiac Enzymes: No results found for this basename: CKTOTAL, CKMB, CKMBINDEX, TROPONINI,  in the last 168 hours BNP: BNP (last 3 results) No results found for this basename: PROBNP,  in the last 8760 hours CBG:  Recent Labs Lab 03/06/14 1557 03/06/14 1935 03/07/14 0035 03/07/14 0503 03/07/14 0824  GLUCAP 94 91 88 93 79    Additional labs: 1. TSH:  5.110   Signed:  Elease Etienne, MD, FACP, FHM. [facility] Pager [phone]  If 7PM-7AM, please contact night-coverage [email or web address] Password [facility] 03/07/2014, 11:44 AM

## 2014-03-08 LAB — LAMOTRIGINE LEVEL: Lamotrigine Lvl: 1.7 ug/mL — ABNORMAL LOW (ref 4.0–18.0)

## 2014-05-21 DEATH — deceased
